# Patient Record
Sex: Female | Born: 1935 | Race: White | Hispanic: No | Smoking: Never smoker
Health system: Southern US, Community
[De-identification: ages and names within clinical notes are randomized; demographics above are authoritative.]

## PROBLEM LIST (undated history)

## (undated) DIAGNOSIS — R233 Spontaneous ecchymoses: Secondary | ICD-10-CM

## (undated) DIAGNOSIS — C50911 Malignant neoplasm of unspecified site of right female breast: Secondary | ICD-10-CM

## (undated) DIAGNOSIS — T783XXA Angioneurotic edema, initial encounter: Secondary | ICD-10-CM

## (undated) DIAGNOSIS — L509 Urticaria, unspecified: Secondary | ICD-10-CM

## (undated) DIAGNOSIS — C50919 Malignant neoplasm of unspecified site of unspecified female breast: Secondary | ICD-10-CM

## (undated) DIAGNOSIS — R238 Other skin changes: Secondary | ICD-10-CM

## (undated) DIAGNOSIS — C801 Malignant (primary) neoplasm, unspecified: Secondary | ICD-10-CM

## (undated) HISTORY — PX: APPENDECTOMY: SHX54

## (undated) HISTORY — DX: Malignant neoplasm of unspecified site of right female breast: C50.911

## (undated) HISTORY — DX: Other skin changes: R23.8

## (undated) HISTORY — DX: Spontaneous ecchymoses: R23.3

## (undated) HISTORY — DX: Angioneurotic edema, initial encounter: T78.3XXA

## (undated) HISTORY — DX: Urticaria, unspecified: L50.9

## (undated) HISTORY — DX: Malignant (primary) neoplasm, unspecified: C80.1

## (undated) HISTORY — DX: Malignant neoplasm of unspecified site of unspecified female breast: C50.919

## (undated) HISTORY — PX: CATARACT EXTRACTION: SUR2

---

## 1994-10-04 HISTORY — PX: BREAST SURGERY: SHX581

## 1998-10-19 ENCOUNTER — Other Ambulatory Visit: Admission: RE | Admit: 1998-10-19 | Discharge: 1998-10-19 | Payer: Self-pay | Admitting: Obstetrics and Gynecology

## 1998-12-12 ENCOUNTER — Ambulatory Visit (HOSPITAL_COMMUNITY): Admission: RE | Admit: 1998-12-12 | Discharge: 1998-12-12 | Payer: Self-pay | Admitting: Obstetrics and Gynecology

## 1999-09-29 ENCOUNTER — Encounter: Admission: RE | Admit: 1999-09-29 | Discharge: 1999-09-29 | Payer: Self-pay | Admitting: General Surgery

## 1999-09-29 ENCOUNTER — Encounter: Payer: Self-pay | Admitting: General Surgery

## 1999-10-04 ENCOUNTER — Other Ambulatory Visit: Admission: RE | Admit: 1999-10-04 | Discharge: 1999-10-04 | Payer: Self-pay | Admitting: Obstetrics and Gynecology

## 2000-10-03 ENCOUNTER — Encounter: Admission: RE | Admit: 2000-10-03 | Discharge: 2000-10-03 | Payer: Self-pay | Admitting: Obstetrics and Gynecology

## 2000-10-03 ENCOUNTER — Encounter: Payer: Self-pay | Admitting: Obstetrics and Gynecology

## 2000-10-18 ENCOUNTER — Other Ambulatory Visit: Admission: RE | Admit: 2000-10-18 | Discharge: 2000-10-18 | Payer: Self-pay | Admitting: Obstetrics and Gynecology

## 2001-10-30 ENCOUNTER — Encounter: Admission: RE | Admit: 2001-10-30 | Discharge: 2001-10-30 | Payer: Self-pay | Admitting: Obstetrics and Gynecology

## 2001-10-30 ENCOUNTER — Encounter: Payer: Self-pay | Admitting: Obstetrics and Gynecology

## 2001-12-10 ENCOUNTER — Other Ambulatory Visit: Admission: RE | Admit: 2001-12-10 | Discharge: 2001-12-10 | Payer: Self-pay | Admitting: Obstetrics & Gynecology

## 2012-01-24 ENCOUNTER — Encounter (INDEPENDENT_AMBULATORY_CARE_PROVIDER_SITE_OTHER): Payer: Self-pay | Admitting: Surgery

## 2012-01-27 ENCOUNTER — Encounter (INDEPENDENT_AMBULATORY_CARE_PROVIDER_SITE_OTHER): Payer: Self-pay | Admitting: Surgery

## 2012-01-27 ENCOUNTER — Ambulatory Visit (INDEPENDENT_AMBULATORY_CARE_PROVIDER_SITE_OTHER): Payer: Medicare Other | Admitting: Surgery

## 2012-01-27 VITALS — BP 120/86 | HR 64 | Temp 97.3°F | Resp 14 | Ht 65.0 in | Wt 109.6 lb

## 2012-01-27 DIAGNOSIS — C50919 Malignant neoplasm of unspecified site of unspecified female breast: Secondary | ICD-10-CM

## 2012-01-27 DIAGNOSIS — C50912 Malignant neoplasm of unspecified site of left female breast: Secondary | ICD-10-CM | POA: Insufficient documentation

## 2012-01-27 NOTE — Progress Notes (Signed)
Long-term followup of a patient of Dr. Theron Arista who is status post left lumpectomy and axillary lymph node dissection in 1996 followed by radiation and tamoxifen. Her mammogram last year at [facility] was normal. She has had no changes in her health status. She comes in today for her annual examination.  Examination On the right side, the patient has fibrocystic changes with no dominant masses. No axillary or supraclavicular lymphadenopathy.  On the left side, she has some contraction at her axillary scar. No palpable masses in the left breast. No lymphadenopathy noted.  Plan Continue with annual examinations and mammograms. Followup one year  Wilmon Arms. Corliss Skains, MD, [facility] Surgery  01/27/2012 11:41 AM

## 2012-02-14 ENCOUNTER — Encounter (INDEPENDENT_AMBULATORY_CARE_PROVIDER_SITE_OTHER): Payer: Self-pay

## 2012-10-02 ENCOUNTER — Encounter (INDEPENDENT_AMBULATORY_CARE_PROVIDER_SITE_OTHER): Payer: Self-pay

## 2013-01-29 ENCOUNTER — Ambulatory Visit (INDEPENDENT_AMBULATORY_CARE_PROVIDER_SITE_OTHER): Payer: Medicare PPO | Admitting: Surgery

## 2013-01-29 ENCOUNTER — Ambulatory Visit (INDEPENDENT_AMBULATORY_CARE_PROVIDER_SITE_OTHER): Payer: Medicare Other | Admitting: Surgery

## 2013-01-29 ENCOUNTER — Encounter (INDEPENDENT_AMBULATORY_CARE_PROVIDER_SITE_OTHER): Payer: Self-pay | Admitting: Surgery

## 2013-01-29 VITALS — BP 110/70 | HR 62 | Temp 98.1°F | Resp 18 | Ht 65.0 in | Wt 110.6 lb

## 2013-01-29 DIAGNOSIS — C50912 Malignant neoplasm of unspecified site of left female breast: Secondary | ICD-10-CM

## 2013-01-29 DIAGNOSIS — C50919 Malignant neoplasm of unspecified site of unspecified female breast: Secondary | ICD-10-CM

## 2013-01-29 NOTE — Progress Notes (Signed)
Long-term followup of a patient of Dr. Francina Ames who is status post left lumpectomy and axillary lymph node dissection in 1996 followed by radiation and tamoxifen. Her mammogram last year at [facility] from 08/17/12 was normal. She has had no changes in her health status. She comes in today for her annual examination.  Examination  On the right side, the patient has fibrocystic changes with no dominant masses. No axillary or supraclavicular lymphadenopathy.  On the left side, she has some contraction at her axillary scar. No palpable masses in the left breast. No lymphadenopathy noted.     Plan  Continue with annual examinations and mammograms. Followup one year   Wilmon Arms. Corliss Skains, MD, [facility] Surgery  General/ Trauma Surgery  01/29/2013 3:39 PM

## 2013-12-24 ENCOUNTER — Encounter: Payer: Self-pay | Admitting: Podiatrist

## 2013-12-24 ENCOUNTER — Ambulatory Visit (INDEPENDENT_AMBULATORY_CARE_PROVIDER_SITE_OTHER): Payer: Medicare PPO | Admitting: Podiatrist

## 2013-12-24 ENCOUNTER — Ambulatory Visit: Payer: Medicare PPO

## 2013-12-24 VITALS — BP 126/82 | HR 78 | Resp 18

## 2013-12-24 DIAGNOSIS — M7150 Other bursitis, not elsewhere classified, unspecified site: Secondary | ICD-10-CM

## 2013-12-24 DIAGNOSIS — R52 Pain, unspecified: Secondary | ICD-10-CM

## 2013-12-24 DIAGNOSIS — M1990 Unspecified osteoarthritis, unspecified site: Secondary | ICD-10-CM

## 2013-12-24 MED ORDER — DICLOFENAC SODIUM 1 % TD GEL: 2.0000 g | Freq: Four times a day (QID) | TRANSDERMAL | Status: DC

## 2013-12-24 NOTE — Progress Notes (Signed)
   Subjective:    Patient ID: Theresa Lutz, female    DOB: 06/12/1933, 80 y.o.   MRN: [id number]  HPI this foot has hurt for 2 weeks now and hurts on top and now has moved to the bunion area on my right foot and sore and tender and hurts with shoes and some redness    Review of Systems  Hematological: Bruises/bleeds easily.  All other systems reviewed and are negative.      Objective:   Physical Exam Neurovascular status is intact with palpable pedal pulses at 2/4 DP and PT bilateral and capillary refill time within normal limits. Neurological sensation is also intact bilaterally both upper critically and protectively. Severe bunion deformity is present on the right foot. Elongated second digit and second metatarsal is noted. Arthritic changes at the dorsal mid foot is noted right in comparison with the left. Discomfort on palpation in this area is also present and mild. Dermatological examination reveals intact and supple skin.  X-ray show arthritis right foot. No acute osseous abnormalities are present.    Assessment & Plan:  Arthritis and bursitis right foot  Plan: The patient has decreased significantly and therefore conservative treatments were discussed including icing, compression, shoe gear changes. Discussed an injection however at this time her symptoms do not wart an injection. She'll be seen back as needed for followup if she has any problems or concerns she will call.

## 2013-12-24 NOTE — Patient Instructions (Signed)

## 2014-01-02 NOTE — Progress Notes (Signed)
I went into the chart review and I went to the imaging and it shows that the x-rays are in/Theresa Lutz

## 2014-01-06 ENCOUNTER — Encounter (INDEPENDENT_AMBULATORY_CARE_PROVIDER_SITE_OTHER): Payer: Self-pay | Admitting: Surgery

## 2014-02-19 ENCOUNTER — Encounter (INDEPENDENT_AMBULATORY_CARE_PROVIDER_SITE_OTHER): Payer: Self-pay

## 2015-02-13 ENCOUNTER — Other Ambulatory Visit: Payer: Self-pay

## 2015-02-23 ENCOUNTER — Ambulatory Visit: Payer: Self-pay | Admitting: Surgery

## 2015-02-23 DIAGNOSIS — C50911 Malignant neoplasm of unspecified site of right female breast: Secondary | ICD-10-CM

## 2015-02-23 NOTE — H&P (Signed)
History of Present Illness Theresa Lutz. Theresa Selner MD; 02/23/2015 2:05 PM) Patient words: breast mass.  The patient is a 81 year old female who presents with breast cancer. This is a fairly healthy 81 year old female who underwent left lumpectomy and axillary lymph node dissection in 1996 by Dr. Marylene Buerger for invasive mammary cancer. Surgery was followed by radiation as well as 5 years of tamoxifen. The patient underwent screening mammogram on 01/29/15 at [facility]. This showed no sign of any suspicious findings on the left side. However the right side had a mass that required further investigation. On 02/11/15 she underwent ultrasound and diagnostic mammogram. This showed a suspicious 4 mm mass in the upper-inner quadrant of the right breast at the posterior depth. There is no sign of any lymphadenopathy on the right. She underwent core biopsy on 02/13/15. This revealed invasive and in situ mammary carcinoma. Further studies showed ER PR strongly positive Ki-67 10% the diagnosis shows invasive and in situ lobular carcinoma. The patient presents now for surgical discussion.  Previous surgeon - Dr. Marylene Buerger Oncology - [name] Other Problems Theresa Borer, LPN; 5/62/1308 6:57 AM) Breast Cancer  Past Surgical History (Theresa Eversole, LPN; 8/46/9629 5:28 AM) Appendectomy Breast Biopsy Bilateral. Breast Mass; Local Excision Left. Cataract Surgery Bilateral. Colon Polyp Removal - Colonoscopy Sentinel Lymph Node Biopsy  Diagnostic Studies History (Theresa Eversole, LPN; 12/17/2438 1:02 AM) Colonoscopy 1-5 years ago Mammogram within last year  Allergies (Theresa Eversole, LPN; 03/30/3663 4:03 AM) No Known Drug Allergies 02/23/2015  Medication History (Theresa Eversole, LPN; 4/74/2595 6:38 AM) Calcium & Vit D3 Bone Health (Oral) Active.  Social History (Theresa Eversole, LPN; 7/56/4332 9:51 AM) Alcohol use Occasional alcohol use. Caffeine use  Coffee. No drug use Tobacco use Never smoker.  Family History Theresa Borer, LPN; 8/84/1660 6:30 AM) Cerebrovascular Accident Mother. Diabetes Mellitus Son. Hypertension Mother. Respiratory Condition Father.  Pregnancy / Birth History Theresa Borer, LPN; 1/60/1093 2:35 AM) Age at menarche 13 years. Age of menopause 89-50 Gravida 53 Maternal age 47-25 Para 68     Review of Systems (Theresa Eversole LPN; 5/73/2202 5:42 AM) General Not Present- Appetite Loss, Chills, Fatigue, Fever, Night Sweats, Weight Gain and Weight Loss. Skin Not Present- Change in Wart/Mole, Dryness, Hives, Jaundice, New Lesions, Non-Healing Wounds, Rash and Ulcer. HEENT Not Present- Earache, Hearing Loss, Hoarseness, Nose Bleed, Oral Ulcers, Ringing in the Ears, Seasonal Allergies, Sinus Pain, Sore Throat, Visual Disturbances, Wears glasses/contact lenses and Yellow Eyes. Respiratory Not Present- Bloody sputum, Chronic Cough, Difficulty Breathing, Snoring and Wheezing. Breast Present- Breast Mass. Not Present- Breast Pain, Nipple Discharge and Skin Changes. Cardiovascular Not Present- Chest Pain, Difficulty Breathing Lying Down, Leg Cramps, Palpitations, Rapid Heart Rate, Shortness of Breath and Swelling of Extremities. Gastrointestinal Not Present- Abdominal Pain, Bloating, Bloody Stool, Change in Bowel Habits, Chronic diarrhea, Constipation, Difficulty Swallowing, Excessive gas, Gets full quickly at meals, Hemorrhoids, Indigestion, Nausea, Rectal Pain and Vomiting. Female Genitourinary Not Present- Frequency, Nocturia, Painful Urination, Pelvic Pain and Urgency. Musculoskeletal Not Present- Back Pain, Joint Pain, Joint Stiffness, Muscle Pain, Muscle Weakness and Swelling of Extremities. Neurological Not Present- Decreased Memory, Fainting, Headaches, Numbness, Seizures, Tingling, Tremor, Trouble walking and Weakness. Psychiatric Not Present- Anxiety, Bipolar, Change in Sleep Pattern, Depression, Fearful  and Frequent crying. Endocrine Not Present- Cold Intolerance, Excessive Hunger, Hair Changes, Heat Intolerance, Hot flashes and New Diabetes. Hematology Not Present- Easy Bruising, Excessive bleeding, Gland problems, HIV and Persistent Infections.  Vitals (Theresa Eversole LPN; 03/11/2375 2:83 AM) 02/23/2015 9:32  AM Weight: 111.8 lb Height: 64in Body Surface Area: 1.51 m Body Mass Index: 19.19 kg/m Temp.: 97.63F(Oral)  Pulse: 82 (Regular)  BP: 142/80 (Sitting, Left Arm, Standard)     Physical Exam [name] K. Prudence Heiny MD; 02/23/2015 2:04 PM)  The physical exam findings are as follows: Note:WDWN in NAD HEENT: EOMI, sclera anicteric Neck: No masses, no thyromegaly Lungs: CTA bilaterally; normal respiratory effort Breasts - well-healed left breast incision and axillary incision. No palpable masses or lymphadenopathy; No retraction or nipple discharge. Right breast - no palpable masses or lymphadenopathy CV: Regular rate and rhythm; no murmurs Abd: +bowel sounds, soft, non-tender, no masses Ext: Well-perfused; no edema Skin: Warm, dry; no sign of jaundice    Assessment & Plan [name] K. [name] MD; 02/23/2015 2:04 PM)  INVASIVE LOBULAR CARCINOMA OF LEFT BREAST, STAGE 1 (174.9  C50.912)  Current Plans Schedule for Surgery - left seed-localized lumpectomy and sentinel lymph node biopsy. The surgical procedure has been discussed with the patient. Potential risks, benefits, alternative treatments, and expected outcomes have been explained. All of the patient's questions at this time have been answered. The likelihood of reaching the patient's treatment goal is good. The patient understand the proposed surgical procedure and wishes to proceed. Note:We discussed treatment options - mastectomy vs. breast conserving therapy. She has opted for breast conserving therapy, similar to her previous breast cancer.   Theresa Lutz. [name], MD, [facility] Surgery  General/ Trauma  Surgery  02/23/2015 2:05 PM

## 2015-02-27 ENCOUNTER — Encounter (HOSPITAL_COMMUNITY)
Admission: RE | Admit: 2015-02-27 | Discharge: 2015-02-27 | Disposition: A | Discharge status: Home / Self Care | Payer: Medicare PPO | Source: Ambulatory Visit | Attending: Surgery | Admitting: Surgery

## 2015-02-27 ENCOUNTER — Encounter (HOSPITAL_COMMUNITY): Payer: Self-pay

## 2015-02-27 DIAGNOSIS — C50911 Malignant neoplasm of unspecified site of right female breast: Secondary | ICD-10-CM | POA: Insufficient documentation

## 2015-02-27 DIAGNOSIS — Z01812 Encounter for preprocedural laboratory examination: Secondary | ICD-10-CM | POA: Diagnosis not present

## 2015-02-27 LAB — BASIC METABOLIC PANEL
Anion gap: 8 (ref 5–15)
BUN: 9 mg/dL (ref 6–20)
CALCIUM: 8.8 mg/dL — AB (ref 8.9–10.3)
CO2: 26 mmol/L (ref 22–32)
Chloride: 104 mmol/L (ref 101–111)
Creatinine, Ser: 1.05 mg/dL — ABNORMAL HIGH (ref 0.44–1.00)
GFR calc Af Amer: 57 mL/min — ABNORMAL LOW (ref >60)
GFR, EST NON AFRICAN AMERICAN: 49 mL/min — AB (ref >60)
Glucose, Bld: 92 mg/dL (ref 65–99)
Potassium: 4 mmol/L (ref 3.5–5.1)
SODIUM: 138 mmol/L (ref 135–145)

## 2015-02-27 LAB — CBC
HEMATOCRIT: 43.5 % (ref 36.0–46.0)
HEMOGLOBIN: 14.9 g/dL (ref 12.0–15.0)
MCH: 35.3 pg — ABNORMAL HIGH (ref 26.0–34.0)
MCHC: 34.3 g/dL (ref 30.0–36.0)
MCV: 103.1 fL — ABNORMAL HIGH (ref 78.0–100.0)
Platelets: 234 10*3/uL (ref 150–400)
RBC: 4.22 MIL/uL (ref 3.87–5.11)
RDW: 13 % (ref 11.5–15.5)
WBC: 7.4 10*3/uL (ref 4.0–10.5)

## 2015-02-27 NOTE — Pre-Procedure Instructions (Signed)
    Theresa Lutz  02/27/2015      [name] - [place], [place] - [address] [name]  [address] Phone: [phone] Fax: [phone]    Your procedure is scheduled on 03-04-2015  Wednesday   Report to [facility] Admitting at 6:30 A.M.   Call this number if you have problems the morning of surgery:  [phone]   Remember:  Do not eat food or drink liquids after midnight.   Take these medicines the morning of surgery with A SIP OF WATER  NONE   Do not wear jewelry, make-up or nail polish.  Do not wear lotions, powders, or perfumes.  .  Do not shave 48 hours prior to surgery.     Do not bring valuables to the hospital.  [facility] is not responsible for any belongings or valuables.  Contacts, dentures or bridgework may not be worn into surgery.  Leave your suitcase in the car.  After surgery it may be brought to your room.  For patients admitted to the hospital, discharge time will be determined by your treatment team.  Patients discharged the day of surgery will not be allowed to drive home.   Name and phone number of your driver:      Special instructions:  See attached sheet for instructions on CHG shower/bath  Please read over the following fact sheets that you were given. Pain Booklet, Coughing and Deep Breathing and Surgical Site Infection Prevention

## 2015-03-02 ENCOUNTER — Other Ambulatory Visit: Payer: Self-pay | Admitting: Surgery

## 2015-03-02 DIAGNOSIS — C50911 Malignant neoplasm of unspecified site of right female breast: Secondary | ICD-10-CM

## 2015-03-03 ENCOUNTER — Ambulatory Visit
Admission: RE | Admit: 2015-03-03 | Discharge: 2015-03-03 | Disposition: A | Discharge status: Home / Self Care | Payer: Medicare PPO | Source: Ambulatory Visit | Attending: Surgery | Admitting: Surgery

## 2015-03-03 DIAGNOSIS — C50911 Malignant neoplasm of unspecified site of right female breast: Secondary | ICD-10-CM

## 2015-03-04 ENCOUNTER — Ambulatory Visit
Admission: RE | Admit: 2015-03-04 | Discharge: 2015-03-04 | Disposition: A | Discharge status: Home / Self Care | Payer: Medicare PPO | Source: Ambulatory Visit | Attending: Surgery | Admitting: Surgery

## 2015-03-04 ENCOUNTER — Encounter (HOSPITAL_COMMUNITY): Payer: Self-pay | Admitting: *Deleted

## 2015-03-04 ENCOUNTER — Ambulatory Visit (HOSPITAL_COMMUNITY): Payer: Medicare PPO | Admitting: Anesthesiology

## 2015-03-04 ENCOUNTER — Ambulatory Visit (HOSPITAL_COMMUNITY)
Admission: RE | Admit: 2015-03-04 | Discharge: 2015-03-04 | Disposition: A | Discharge status: Home / Self Care | Payer: Medicare PPO | Source: Ambulatory Visit | Attending: Surgery | Admitting: Surgery

## 2015-03-04 ENCOUNTER — Encounter (HOSPITAL_COMMUNITY)
Admission: RE | Disposition: A | Discharge status: Home / Self Care | Payer: Self-pay | Source: Ambulatory Visit | Attending: Surgery

## 2015-03-04 DIAGNOSIS — N6091 Unspecified benign mammary dysplasia of right breast: Secondary | ICD-10-CM | POA: Diagnosis not present

## 2015-03-04 DIAGNOSIS — N6011 Diffuse cystic mastopathy of right breast: Secondary | ICD-10-CM | POA: Insufficient documentation

## 2015-03-04 DIAGNOSIS — D0501 Lobular carcinoma in situ of right breast: Secondary | ICD-10-CM | POA: Insufficient documentation

## 2015-03-04 DIAGNOSIS — R920 Mammographic microcalcification found on diagnostic imaging of breast: Secondary | ICD-10-CM | POA: Insufficient documentation

## 2015-03-04 DIAGNOSIS — N62 Hypertrophy of breast: Secondary | ICD-10-CM | POA: Insufficient documentation

## 2015-03-04 DIAGNOSIS — C50911 Malignant neoplasm of unspecified site of right female breast: Secondary | ICD-10-CM

## 2015-03-04 DIAGNOSIS — Z923 Personal history of irradiation: Secondary | ICD-10-CM | POA: Diagnosis not present

## 2015-03-04 DIAGNOSIS — Z79899 Other long term (current) drug therapy: Secondary | ICD-10-CM | POA: Diagnosis not present

## 2015-03-04 HISTORY — PX: BREAST LUMPECTOMY WITH RADIOACTIVE SEED AND SENTINEL LYMPH NODE BIOPSY: SHX6550

## 2015-03-04 SURGERY — BREAST LUMPECTOMY WITH RADIOACTIVE SEED AND SENTINEL LYMPH NODE BIOPSY
Anesthesia: General | Site: Breast | Laterality: Right

## 2015-03-04 MED ORDER — HYDROCODONE-ACETAMINOPHEN 5-325 MG PO TABS: 1.0000 | ORAL_TABLET | ORAL | Status: DC | PRN

## 2015-03-04 MED ORDER — MIDAZOLAM HCL 2 MG/2ML IJ SOLN
INTRAMUSCULAR | Status: AC
  Administered 2015-03-04: 1 mg via INTRAVENOUS
  Filled 2015-03-04: qty 2

## 2015-03-04 MED ORDER — TECHNETIUM TC 99M SULFUR COLLOID FILTERED
1.0000 | Freq: Once | INTRAVENOUS | Status: AC | PRN
  Administered 2015-03-04: 1 via INTRADERMAL

## 2015-03-04 MED ORDER — FENTANYL CITRATE (PF) 250 MCG/5ML IJ SOLN
INTRAMUSCULAR | Status: AC
  Filled 2015-03-04: qty 5

## 2015-03-04 MED ORDER — LIDOCAINE HCL (CARDIAC) 20 MG/ML IV SOLN
INTRAVENOUS | Status: DC | PRN
  Administered 2015-03-04: 40 mg via INTRAVENOUS

## 2015-03-04 MED ORDER — SUCCINYLCHOLINE CHLORIDE 20 MG/ML IJ SOLN
INTRAMUSCULAR | Status: DC | PRN
  Administered 2015-03-04: 80 mg via INTRAVENOUS

## 2015-03-04 MED ORDER — FENTANYL CITRATE (PF) 100 MCG/2ML IJ SOLN: 25.0000 ug | INTRAMUSCULAR | Status: DC | PRN

## 2015-03-04 MED ORDER — DEXAMETHASONE SODIUM PHOSPHATE 4 MG/ML IJ SOLN
INTRAMUSCULAR | Status: DC | PRN
  Administered 2015-03-04: 4 mg via INTRAVENOUS

## 2015-03-04 MED ORDER — PROPOFOL 10 MG/ML IV BOLUS
INTRAVENOUS | Status: AC
  Filled 2015-03-04: qty 20

## 2015-03-04 MED ORDER — CHLORHEXIDINE GLUCONATE 4 % EX LIQD: 1.0000 "application " | Freq: Once | CUTANEOUS | Status: DC

## 2015-03-04 MED ORDER — 0.9 % SODIUM CHLORIDE (POUR BTL) OPTIME
TOPICAL | Status: DC | PRN
  Administered 2015-03-04: 1000 mL

## 2015-03-04 MED ORDER — ONDANSETRON HCL 4 MG/2ML IJ SOLN
INTRAMUSCULAR | Status: AC
  Filled 2015-03-04: qty 2

## 2015-03-04 MED ORDER — ROCURONIUM BROMIDE 50 MG/5ML IV SOLN
INTRAVENOUS | Status: AC
  Filled 2015-03-04: qty 1

## 2015-03-04 MED ORDER — BUPIVACAINE-EPINEPHRINE 0.25% -1:200000 IJ SOLN
INTRAMUSCULAR | Status: DC | PRN
  Administered 2015-03-04: 18 mL

## 2015-03-04 MED ORDER — ONDANSETRON HCL 4 MG/2ML IJ SOLN: 4.0000 mg | INTRAMUSCULAR | Status: DC | PRN

## 2015-03-04 MED ORDER — CEFAZOLIN SODIUM-DEXTROSE 2-3 GM-% IV SOLR
2.0000 g | INTRAVENOUS | Status: AC
  Administered 2015-03-04: 2 g via INTRAVENOUS
  Filled 2015-03-04: qty 50

## 2015-03-04 MED ORDER — PROPOFOL 10 MG/ML IV BOLUS
INTRAVENOUS | Status: DC | PRN
  Administered 2015-03-04: 70 mg via INTRAVENOUS
  Administered 2015-03-04: 110 mg via INTRAVENOUS
  Administered 2015-03-04: 20 mg via INTRAVENOUS

## 2015-03-04 MED ORDER — LACTATED RINGERS IV SOLN
INTRAVENOUS | Status: DC | PRN
  Administered 2015-03-04 (×2): via INTRAVENOUS

## 2015-03-04 MED ORDER — LIDOCAINE HCL (CARDIAC) 20 MG/ML IV SOLN
INTRAVENOUS | Status: AC
  Filled 2015-03-04: qty 5

## 2015-03-04 MED ORDER — SODIUM CHLORIDE 0.9 % IJ SOLN
INTRAMUSCULAR | Status: AC
  Filled 2015-03-04: qty 10

## 2015-03-04 MED ORDER — SUCCINYLCHOLINE CHLORIDE 20 MG/ML IJ SOLN
INTRAMUSCULAR | Status: AC
  Filled 2015-03-04: qty 1

## 2015-03-04 MED ORDER — ONDANSETRON HCL 4 MG/2ML IJ SOLN
INTRAMUSCULAR | Status: DC | PRN
  Administered 2015-03-04: 4 mg via INTRAVENOUS

## 2015-03-04 MED ORDER — PHENYLEPHRINE 40 MCG/ML (10ML) SYRINGE FOR IV PUSH (FOR BLOOD PRESSURE SUPPORT)
PREFILLED_SYRINGE | INTRAVENOUS | Status: AC
  Filled 2015-03-04: qty 10

## 2015-03-04 MED ORDER — PHENYLEPHRINE HCL 10 MG/ML IJ SOLN
10.0000 mg | INTRAVENOUS | Status: DC | PRN
  Administered 2015-03-04: 25 ug/min via INTRAVENOUS

## 2015-03-04 MED ORDER — METHYLENE BLUE 1 % INJ SOLN
INTRAMUSCULAR | Status: AC
  Filled 2015-03-04: qty 10

## 2015-03-04 MED ORDER — EPHEDRINE SULFATE 50 MG/ML IJ SOLN
INTRAMUSCULAR | Status: AC
  Filled 2015-03-04: qty 1

## 2015-03-04 MED ORDER — MORPHINE SULFATE 2 MG/ML IJ SOLN: 2.0000 mg | INTRAMUSCULAR | Status: DC | PRN

## 2015-03-04 MED ORDER — BUPIVACAINE-EPINEPHRINE (PF) 0.25% -1:200000 IJ SOLN
INTRAMUSCULAR | Status: AC
  Filled 2015-03-04: qty 30

## 2015-03-04 MED ORDER — METHYLENE BLUE 1 % INJ SOLN
INTRAMUSCULAR | Status: DC | PRN
  Administered 2015-03-04: 5 mL via INTRAMUSCULAR

## 2015-03-04 MED ORDER — DEXAMETHASONE SODIUM PHOSPHATE 4 MG/ML IJ SOLN
INTRAMUSCULAR | Status: AC
  Filled 2015-03-04: qty 1

## 2015-03-04 MED ORDER — FENTANYL CITRATE (PF) 100 MCG/2ML IJ SOLN
INTRAMUSCULAR | Status: DC | PRN
  Administered 2015-03-04 (×2): 75 ug via INTRAVENOUS

## 2015-03-04 SURGICAL SUPPLY — 43 items
APPLIER CLIP 9.375 MED OPEN (MISCELLANEOUS) ×3
BINDER BREAST LRG (GAUZE/BANDAGES/DRESSINGS) IMPLANT
BINDER BREAST XLRG (GAUZE/BANDAGES/DRESSINGS) IMPLANT
BLADE SURG 15 STRL LF DISP TIS (BLADE) ×1 IMPLANT
BLADE SURG 15 STRL SS (BLADE) ×2
CANISTER SUCTION 2500CC (MISCELLANEOUS) ×3 IMPLANT
CHLORAPREP W/TINT 26ML (MISCELLANEOUS) ×3 IMPLANT
CLIP APPLIE 9.375 MED OPEN (MISCELLANEOUS) ×1 IMPLANT
CONT SPECI 4OZ STER CLIK (MISCELLANEOUS) ×6 IMPLANT
COVER PROBE W GEL 5X96 (DRAPES) ×3 IMPLANT
COVER SURGICAL LIGHT HANDLE (MISCELLANEOUS) ×3 IMPLANT
DEVICE DUBIN SPECIMEN MAMMOGRA (MISCELLANEOUS) ×3 IMPLANT
DRAPE CHEST BREAST 15X10 FENES (DRAPES) ×3 IMPLANT
DRAPE UTILITY W/TAPE 26X15 (DRAPES) IMPLANT
DRAPE UTILITY XL STRL (DRAPES) ×3 IMPLANT
ELECT CAUTERY BLADE 6.4 (BLADE) ×3 IMPLANT
ELECT REM PT RETURN 9FT ADLT (ELECTROSURGICAL) ×3
ELECTRODE REM PT RTRN 9FT ADLT (ELECTROSURGICAL) ×1 IMPLANT
GLOVE BIO SURGEON STRL SZ7 (GLOVE) ×3 IMPLANT
GLOVE BIOGEL PI IND STRL 7.5 (GLOVE) ×1 IMPLANT
GLOVE BIOGEL PI INDICATOR 7.5 (GLOVE) ×2
GOWN STRL REUS W/ TWL LRG LVL3 (GOWN DISPOSABLE) ×3 IMPLANT
GOWN STRL REUS W/TWL LRG LVL3 (GOWN DISPOSABLE) ×6
KIT BASIN OR (CUSTOM PROCEDURE TRAY) ×3 IMPLANT
KIT MARKER MARGIN INK (KITS) ×3 IMPLANT
LIQUID BAND (GAUZE/BANDAGES/DRESSINGS) ×3 IMPLANT
NDL SAFETY ECLIPSE 18X1.5 (NEEDLE) ×1 IMPLANT
NEEDLE HYPO 18GX1.5 SHARP (NEEDLE) ×2
NEEDLE HYPO 25X1 1.5 SAFETY (NEEDLE) ×6 IMPLANT
NS IRRIG 1000ML POUR BTL (IV SOLUTION) ×3 IMPLANT
PACK SURGICAL SETUP 50X90 (CUSTOM PROCEDURE TRAY) ×3 IMPLANT
PENCIL BUTTON HOLSTER BLD 10FT (ELECTRODE) ×3 IMPLANT
SPONGE LAP 18X18 X RAY DECT (DISPOSABLE) ×3 IMPLANT
SPONGE LAP 4X18 X RAY DECT (DISPOSABLE) ×3 IMPLANT
SUT MNCRL AB 4-0 PS2 18 (SUTURE) ×6 IMPLANT
SUT VIC AB 3-0 SH 18 (SUTURE) ×3 IMPLANT
SYR BULB 3OZ (MISCELLANEOUS) ×3 IMPLANT
SYR CONTROL 10ML LL (SYRINGE) ×6 IMPLANT
TOWEL OR 17X24 6PK STRL BLUE (TOWEL DISPOSABLE) ×6 IMPLANT
TOWEL OR 17X26 10 PK STRL BLUE (TOWEL DISPOSABLE) IMPLANT
TUBE CONNECTING 12'X1/4 (SUCTIONS) ×1
TUBE CONNECTING 12X1/4 (SUCTIONS) ×2 IMPLANT
YANKAUER SUCT BULB TIP NO VENT (SUCTIONS) ×3 IMPLANT

## 2015-03-04 NOTE — Interval H&P Note (Signed)
History and Physical Interval Note:  03/04/2015 7:37 AM  Theresa Lutz  has presented today for surgery, with the diagnosis of Right Invasive Lobules Carcinoma  The various methods of treatment have been discussed with the patient and family. After consideration of risks, benefits and other options for treatment, the patient has consented to  Procedure(s): RIGHT BREAST LUMPECTOMY WITH RADIOACTIVE SEED AND SENTINEL LYMPH NODE BIOPSY (Right) as a surgical intervention .  The patient's history has been reviewed, patient examined, no change in status, stable for surgery.  I have reviewed the patient's chart and labs.  Questions were answered to the patient's satisfaction.    Correction to the H&P - Previous radiation oncologist was Dr. Ardis Rowan K.

## 2015-03-04 NOTE — H&P (View-Only) (Signed)
History of Present Illness Theresa Lutz. Theresa Koslosky MD; 02/23/2015 2:05 PM) Patient words: breast mass.  The patient is a 81 year old female who presents with breast cancer. This is a fairly healthy 81 year old female who underwent left lumpectomy and axillary lymph node dissection in 1996 by Dr. Marylene Lutz for invasive mammary cancer. Surgery was followed by radiation as well as 5 years of tamoxifen. The patient underwent screening mammogram on 01/29/15 at [facility]. This showed no sign of any suspicious findings on the left side. However the right side had a mass that required further investigation. On 02/11/15 she underwent ultrasound and diagnostic mammogram. This showed a suspicious 4 mm mass in the upper-inner quadrant of the right breast at the posterior depth. There is no sign of any lymphadenopathy on the right. She underwent core biopsy on 02/13/15. This revealed invasive and in situ mammary carcinoma. Further studies showed ER PR strongly positive Ki-67 10% the diagnosis shows invasive and in situ lobular carcinoma. The patient presents now for surgical discussion.  Previous surgeon - Dr. Marylene Lutz Oncology - Theresa Lutz Radiation Oncology - Theresa Lutz Other Problems Theresa Borer, LPN; 2/48/2500 3:70 AM) Breast Cancer  Past Surgical History (Theresa Eversole, LPN; 4/88/8916 9:45 AM) Appendectomy Breast Biopsy Bilateral. Breast Mass; Local Excision Left. Cataract Surgery Bilateral. Colon Polyp Removal - Colonoscopy Sentinel Lymph Node Biopsy  Diagnostic Studies History (Theresa Eversole, LPN; 0/38/8828 0:03 AM) Colonoscopy 1-5 years ago Mammogram within last year  Allergies (Theresa Eversole, LPN; 4/91/7915 0:56 AM) No Known Drug Allergies 02/23/2015  Medication History (Theresa Eversole, LPN; 9/79/4801 6:55 AM) Calcium & Vit D3 Bone Health (Oral) Active.  Social History (Theresa Eversole, LPN; 3/74/8270 7:86 AM) Alcohol use Occasional alcohol use. Caffeine use  Coffee. No drug use Tobacco use Never smoker.  Family History Theresa Borer, LPN; 7/54/4920 1:00 AM) Cerebrovascular Accident Theresa Lutz. Diabetes Mellitus Theresa Lutz. Hypertension Theresa Lutz. Respiratory Condition Theresa Lutz.  Pregnancy / Birth History Theresa Borer, LPN; [birth year]/16/1974 8:83 AM) Age at menarche 77 years. Age of menopause 92-50 Gravida 83 Maternal age 28-25 Para 82     Review of Systems (Theresa Eversole LPN; 2/54/9826 4:15 AM) General Not Present- Appetite Loss, Chills, Fatigue, Fever, Night Sweats, Weight Gain and Weight Loss. Skin Not Present- Change in Wart/Mole, Dryness, Hives, Jaundice, New Lesions, Non-Healing Wounds, Rash and Ulcer. HEENT Not Present- Earache, Hearing Loss, Hoarseness, Nose Bleed, Oral Ulcers, Ringing in the Ears, Seasonal Allergies, Sinus Pain, Sore Throat, Visual Disturbances, Wears glasses/contact lenses and Yellow Eyes. Respiratory Not Present- Bloody sputum, Chronic Cough, Difficulty Breathing, Snoring and Wheezing. Breast Present- Breast Mass. Not Present- Breast Pain, Nipple Discharge and Skin Changes. Cardiovascular Not Present- Chest Pain, Difficulty Breathing Lying Down, Leg Cramps, Palpitations, Rapid Heart Rate, Shortness of Breath and Swelling of Extremities. Gastrointestinal Not Present- Abdominal Pain, Bloating, Bloody Stool, Change in Bowel Habits, Chronic diarrhea, Constipation, Difficulty Swallowing, Excessive gas, Gets full quickly at meals, Hemorrhoids, Indigestion, Nausea, Rectal Pain and Vomiting. Female Genitourinary Not Present- Frequency, Nocturia, Painful Urination, Pelvic Pain and Urgency. Musculoskeletal Not Present- Back Pain, Joint Pain, Joint Stiffness, Muscle Pain, Muscle Weakness and Swelling of Extremities. Neurological Not Present- Decreased Memory, Fainting, Headaches, Numbness, Seizures, Tingling, Tremor, Trouble walking and Weakness. Psychiatric Not Present- Anxiety, Bipolar, Change in Sleep Pattern, Depression, Fearful  and Frequent crying. Endocrine Not Present- Cold Intolerance, Excessive Hunger, Hair Changes, Heat Intolerance, Hot flashes and New Diabetes. Hematology Not Present- Easy Bruising, Excessive bleeding, Gland problems, HIV and Persistent Infections.  Vitals (Theresa Eversole LPN; 05/04/9406 6:80 AM) 02/23/2015 9:32  AM Weight: 111.8 lb Height: 64in Body Surface Area: 1.51 m Body Mass Index: 19.19 kg/m Temp.: 97.75F(Oral)  Pulse: 82 (Regular)  BP: 142/80 (Sitting, Left Arm, Standard)     Physical Exam Theresa Key K. Prescott Truex MD; 02/23/2015 2:04 PM)  The physical exam findings are as follows: Note:WDWN in NAD HEENT: EOMI, sclera anicteric Neck: No masses, no thyromegaly Lungs: CTA bilaterally; normal respiratory effort Breasts - well-healed left breast incision and axillary incision. No palpable masses or lymphadenopathy; No retraction or nipple discharge. Right breast - no palpable masses or lymphadenopathy CV: Regular rate and rhythm; no murmurs Abd: +bowel sounds, soft, non-tender, no masses Ext: Well-perfused; no edema Skin: Warm, dry; no sign of jaundice    Assessment & Plan Theresa Key K. Javen Ridings MD; 02/23/2015 2:04 PM)  INVASIVE LOBULAR CARCINOMA OF LEFT BREAST, STAGE 1 (174.9  C50.912)  Current Plans Schedule for Surgery - left seed-localized lumpectomy and sentinel lymph node biopsy. The surgical procedure has been discussed with the patient. Potential risks, benefits, alternative treatments, and expected outcomes have been explained. All of the patient's questions at this time have been answered. The likelihood of reaching the patient's treatment goal is good. The patient understand the proposed surgical procedure and wishes to proceed. Note:We discussed treatment options - mastectomy vs. breast conserving therapy. She has opted for breast conserving therapy, similar to her previous breast cancer.   Theresa Lutz. Theresa Dover, MD, [facility] Surgery  General/ Trauma  Surgery  02/23/2015 2:05 PM

## 2015-03-04 NOTE — Op Note (Signed)
Pre-op Diagnosis:  Right breast cancer Postop diagnosis: Same Procedure performed: #1 right radioactive seed localized lumpectomy #2 blue dye injection #3 right axillary sentinel lymph node biopsy Surgeon:Audrionna Lampton K. Anesthesia: Gen. Endotracheal  Indications: This is an 81 year old female with a previous history of a left breast cancer status post lumpectomy and axillary lymph node dissection. She received radiation as well as hormonal treatment. She was recently diagnosed with a small right breast cancer. She presents now for radioactive seed localized lumpectomy and axillary sentinel lymph node biopsy.  A radioactive seed was placed yesterday by radiology. She was injected with technetium sulfur colloid around her nipple in the preop area by radiology tech.  Procedure: The patient is brought to the operating room and placed in a supine position on the operating room table. After an adequate level of general anesthesia was obtained the patient's right chest was exposed. I injected methylene blue dye around the nipple after prepping with alcohol. We confirmed the presence of the radioactive seed in the right upper inner quadrant and the presence of an active sentinel lymph node in the right axilla.  The right chest and the right axilla were prepped with ChloraPrep and draped sterile fashion.  Another timeout was taken to ensure the proper patient and proper procedure. The area of activity in the right upper inner quadrant was identified with the neoprobe. We made a transverse incision near the edge of the areola. Dissection was carried down into the breast tissue with cautery. We dissected up to the area of the radioactive seed. A raised margins all the way around this area using the neoprobe for guidance. We then dissected the lumpectomy specimen off of the chest wall. Activity was confirmed within the specimen and there was no residual activity in the lumpectomy cavity. The specimen was oriented  with a paint kit and specimen mammogram confirmed the seed and the biopsy clip within the specimen. This was confirmed by radiology. The lumpectomy cavity was then irrigated and inspected for hemostasis. There was some scarring at the lateral margin so we excised this with cautery as well. This was sent separately as a lateral margin. This was not oriented with paint. We then closed the wound with 3-0 Vicryl and 4-0 Monocryl. We then turned our attention to the axilla.  The specimen and radioactive seed were hand carried to pathology and the arrival of the seed was confirmed in radiology.  The settings were changed on the neoprobe device for sentinel lymph node biopsy. We identified an area of activity and made a transverse incision across this area. Dissection was carried down into the axillary fat with cautery. I was able to easily identify a hot blue lymph node which was excised with cautery. Ex vivo activity was 1100. The  Node was hot and blue. This was sent as sentinel lymph node #1. I then interrogated the axilla again. There is minimal background activity. No other lymph nodes are palpated. The wound is then closed with 3-0 Vicryl and 4-0 Monocryl. Steri-Strips and clean dressings were applied to both areas. The patient was extubated and brought to the recovery room in stable condition.   Imogene Burn. [name], MD, [facility] Surgery  General/ Trauma Surgery  03/04/2015 10:16 AM

## 2015-03-04 NOTE — Discharge Instructions (Signed)
[facility] °Office Phone Number [phone] ° °BREAST BIOPSY/ PARTIAL MASTECTOMY: POST OP INSTRUCTIONS ° °Always review your discharge instruction sheet given to you by the facility where your surgery was performed. ° °IF YOU HAVE DISABILITY OR FAMILY LEAVE FORMS, YOU MUST BRING THEM TO THE OFFICE FOR PROCESSING.  DO NOT GIVE THEM TO YOUR DOCTOR. ° °1. A prescription for pain medication may be given to you upon discharge.  Take your pain medication as prescribed, if needed.  If narcotic pain medicine is not needed, then you may take acetaminophen (Tylenol) or ibuprofen (Advil) as needed. °2. Take your usually prescribed medications unless otherwise directed °3. If you need a refill on your pain medication, please contact your pharmacy.  They will contact our office to request authorization.  Prescriptions will not be filled after 5pm or on week-ends. °4. You should eat very light the first 24 hours after surgery, such as soup, crackers, pudding, etc.  Resume your normal diet the day after surgery. °5. Most patients will experience some swelling and bruising in the breast.  Ice packs and a good support bra will help.  Swelling and bruising can take several days to resolve.  °6. It is common to experience some constipation if taking pain medication after surgery.  Increasing fluid intake and taking a stool softener will usually help or prevent this problem from occurring.  A mild laxative (Milk of Magnesia or Miralax) should be taken according to package directions if there are no bowel movements after 48 hours. °7. Unless discharge instructions indicate otherwise, you may remove your bandages 48 hours after surgery, and you may shower at that time.  You will have steri-strips (small skin tapes) in place directly over the incision.  These strips should be left on the skin for 7-10 days.   Any sutures or staples will be removed at the office during your follow-up visit. °8. ACTIVITIES:  You may resume  regular daily activities (gradually increasing) beginning the next day.  Wearing a good support bra or sports bra minimizes pain and swelling.  You may have sexual intercourse when it is comfortable. °a. You may drive when you no longer are taking prescription pain medication, you can comfortably wear a seatbelt, and you can safely maneuver your car and apply brakes. °b. RETURN TO WORK:  1-2 weeks °9. You should see your doctor in the office for a follow-up appointment approximately two weeks after your surgery.  Your doctor’s nurse will typically make your follow-up appointment when she calls you with your pathology report.  Expect your pathology report 2-3 business days after your surgery.  You may call to check if you do not hear from us after three days. °10. OTHER INSTRUCTIONS: _______________________________________________________________________________________________ _____________________________________________________________________________________________________________________________________ °_____________________________________________________________________________________________________________________________________ °_____________________________________________________________________________________________________________________________________ ° °WHEN TO CALL YOUR DOCTOR: °1. Fever over 101.0 °2. Nausea and/or vomiting. °3. Extreme swelling or bruising. °4. Continued bleeding from incision. °5. Increased pain, redness, or drainage from the incision. ° °The clinic staff is available to answer your questions during regular business hours.  Please don’t hesitate to call and ask to speak to one of the nurses for clinical concerns.  If you have a medical emergency, go to the nearest emergency room or call 911.  A surgeon from [facility] is always on call at the hospital. ° °For further questions, please visit [email or web address]  ° ° °

## 2015-03-04 NOTE — Anesthesia Preprocedure Evaluation (Addendum)
Anesthesia Evaluation  Patient identified by MRN, date of birth, ID band Patient awake    Reviewed: Allergy & Precautions, NPO status , Patient's Chart, lab work & pertinent test results  Airway Mallampati: II  TM Distance: >3 FB Neck ROM: Full    Dental  (+) Teeth Intact   Pulmonary neg pulmonary ROS,  breath sounds clear to auscultation        Cardiovascular negative cardio ROS  Rhythm:Regular Rate:Normal     Neuro/Psych    GI/Hepatic negative GI ROS, Neg liver ROS,   Endo/Other  negative endocrine ROS  Renal/GU negative Renal ROS     Musculoskeletal   Abdominal   Peds  Hematology negative hematology ROS (+)   Anesthesia Other Findings   Reproductive/Obstetrics                            Anesthesia Physical Anesthesia Plan  ASA: II  Anesthesia Plan: General   Post-op Pain Management:    Induction:   Airway Management Planned: LMA  Additional Equipment:   Intra-op Plan:   Post-operative Plan: Extubation in OR  Informed Consent: I have reviewed the patients History and Physical, chart, labs and discussed the procedure including the risks, benefits and alternatives for the proposed anesthesia with the patient or authorized representative who has indicated his/her understanding and acceptance.   Dental advisory given  Plan Discussed with: CRNA and Surgeon  Anesthesia Plan Comments:         Anesthesia Quick Evaluation

## 2015-03-04 NOTE — Anesthesia Postprocedure Evaluation (Signed)
  Anesthesia Post-op Note  Patient: Theresa Lutz  Procedure(s) Performed: Procedure(s): RIGHT RADIOACTIVE SEED GUIDED BREAST LUMPECTOMY AND RIGHT AXILLARY SENTINEL LYMPH NODE BIOPSY (Right)  Patient Location: PACU  Anesthesia Type:General and GA combined with regional for post-op pain  Level of Consciousness: awake and alert   Airway and Oxygen Therapy: Patient Spontanous Breathing  Post-op Pain: none  Post-op Assessment: Post-op Vital signs reviewed              Post-op Vital Signs: stable  Last Vitals:  Filed Vitals:   03/04/15 1129  BP: 147/85  Pulse: 61  Temp:   Resp: 16    Complications: No apparent anesthesia complications

## 2015-03-04 NOTE — Anesthesia Procedure Notes (Signed)
Procedure Name: Intubation Date/Time: 03/04/2015 8:44 AM Performed by: Susa Loffler Pre-anesthesia Checklist: Patient identified, Timeout performed, Emergency Drugs available, Suction available and Patient being monitored Patient Re-evaluated:Patient Re-evaluated prior to inductionOxygen Delivery Method: Circle system utilized Preoxygenation: Pre-oxygenation with 100% oxygen Intubation Type: IV induction Ventilation: Mask ventilation without difficulty Laryngoscope Size: Mac and 3 Grade View: Grade I Tube type: Oral Tube size: 7.0 mm Number of attempts: 1 Airway Equipment and Method: Stylet Placement Confirmation: ETT inserted through vocal cords under direct vision,  positive ETCO2 and breath sounds checked- equal and bilateral Secured at: 21 cm Tube secured with: Tape Dental Injury: Teeth and Oropharynx as per pre-operative assessment  Comments: Attempted to place LMA#4 and #3; unable to seat either LMA with adequate seal. Dr. Orene Desanctis at bedside, convert to GETA. Easy mask, VSS throughout, atraumatic airway.

## 2015-03-04 NOTE — Transfer of Care (Signed)
Immediate Anesthesia Transfer of Care Note  Patient: Theresa Lutz  Procedure(s) Performed: Procedure(s): RIGHT RADIOACTIVE SEED GUIDED BREAST LUMPECTOMY AND RIGHT AXILLARY SENTINEL LYMPH NODE BIOPSY (Right)  Patient Location: PACU  Anesthesia Type:General  Level of Consciousness: awake, alert  and oriented  Airway & Oxygen Therapy: Patient Spontanous Breathing and Patient connected to nasal cannula oxygen  Post-op Assessment: Report given to RN and Post -op Vital signs reviewed and stable  Post vital signs: Reviewed and stable  Last Vitals:  Filed Vitals:   03/04/15 1010  BP: 134/80  Pulse: 68  Temp: 36.4 C  Resp: 24    Complications: No apparent anesthesia complications

## 2015-03-05 ENCOUNTER — Encounter (HOSPITAL_COMMUNITY): Payer: Self-pay | Admitting: Surgery

## 2015-03-30 ENCOUNTER — Telehealth: Payer: Self-pay | Admitting: *Deleted

## 2015-03-30 NOTE — Telephone Encounter (Signed)
Pt returned my call and I confirmed 03/30/15 med onc appt w/ her.  Mailed before appt letter, calendar, welcoming packet & intake form to pt.  Emailed Ammie at  [facility] to make her aware.  Called and spoke with Joelene Millin at pt's PCP and she stated that pt does not need a referral auth.  Placed a copy of the records in Dr. Delight Stare box and took one to HIM to scan.

## 2015-03-30 NOTE — Telephone Encounter (Signed)
Received referral from [facility].  Called and left a message for the pt to return my call so I can schedule her a med onc appt w/ Dr. Jana Hakim.

## 2015-04-17 NOTE — Progress Notes (Signed)
Location of Breast Cancer:breast cancer of upper-inner quadrant of the right breast   Histology per Pathology Report:   03/04/15  Diagnosis 1. Breast, lumpectomy, Right - LOBULAR NEOPLASIA (ATYPICAL LOBULAR HYPERPLASIA AND LOBULAR CARCINOMA IN SITU), SEE COMMENT. - PREVIOUS BIOPSY SITE IDENTIFIED. - SEE TUMOR SYNOPTIC TEMPLATE BELOW. 2. Lymph node, sentinel, biopsy, Right axillary - ONE LYMPH NODE, NEGATIVE FOR TUMOR (0/1), SEE COMMENT. 3. Breast, excision, Right additional lateral margin - LOBULAR NEOPLASIA (ATYPICAL LOBULAR HYPERPLASIA), SEE COMMENT. - SURGICAL MARGINS, NEGATIVE FOR ATYPIA OR MALIGNANCY.  02/13/15 Diagnosis Breast, right, needle core biopsy, 1:30 o'clock, 7 cm fn AMENDED DIAGNOSIS: - INVASIVE AND IN SITU LOBULAR CARCINOMA, SUPPORTED WITH NEGATIVE E-CADHERIN IMMUNOSTAIN. - TUMOR INVOLVES MULTIPLE CORES APPROXIMATELY 3 MM IN MAXIMUM LINEAR DIMENSION. - A BREAST PROGNOSTIC PROFILE WILL BE ORDERED ON BLOCK 1A AND SEPARATELY REPORTED. - SEE COMMENT.  Receptor Status: ER(90%), PR (5%), Her2-neu (neg)  Did patient present with symptoms (if so, please note symptoms) or was this found on screening mammography?: screening mammogram on 01/29/15   Past/Anticipated interventions by surgeon, if any: 03/04/15 -Procedure: RIGHT RADIOACTIVE SEED GUIDED BREAST LUMPECTOMY AND RIGHT AXILLARY SENTINEL LYMPH NODE BIOPSY;  Surgeon: [name], MD;  Location: [facility];  Service: General;  Laterality: Right;   Past/Anticipated interventions by medical oncology, if any: Per Dr. Jana Hakim - anastrozole prescription was given 04/21/2015.  Lymphedema issues, if any:  no  Pain issues, if any:  no   SAFETY ISSUES:  Prior radiation? Yes - to left breast in 1996  Pacemaker/ICD? no  Possible current pregnancy?no  Is the patient on methotrexate? no  Current Complaints / other details:  Reports having an infection in her right breast after surgery and took a course of keflex.  She reports  it is sensitive/tender now.  She is here with her daughter.  BP 141/83 mmHg  Pulse 67  Temp(Src) 97.8 F (36.6 C) (Oral)  Resp 12  Ht '5\' 4"'  (1.626 m)  Wt 112 lb 1.6 oz (50.848 kg)  BMI 19.23 kg/m2    Jacqulyn Liner, RN 04/17/2015,12:36 PM

## 2015-04-20 ENCOUNTER — Other Ambulatory Visit: Payer: Self-pay | Admitting: *Deleted

## 2015-04-20 DIAGNOSIS — C50912 Malignant neoplasm of unspecified site of left female breast: Secondary | ICD-10-CM

## 2015-04-21 ENCOUNTER — Ambulatory Visit: Payer: Medicare PPO

## 2015-04-21 ENCOUNTER — Encounter: Payer: Self-pay | Admitting: Oncology

## 2015-04-21 ENCOUNTER — Other Ambulatory Visit (HOSPITAL_BASED_OUTPATIENT_CLINIC_OR_DEPARTMENT_OTHER): Payer: Medicare PPO

## 2015-04-21 ENCOUNTER — Ambulatory Visit: Payer: Medicare PPO | Admitting: Radiation Oncology

## 2015-04-21 ENCOUNTER — Ambulatory Visit (HOSPITAL_BASED_OUTPATIENT_CLINIC_OR_DEPARTMENT_OTHER): Payer: Medicare PPO | Admitting: Oncology

## 2015-04-21 ENCOUNTER — Other Ambulatory Visit: Payer: Self-pay | Admitting: Nurse Practitioner

## 2015-04-21 VITALS — BP 131/82 | HR 75 | Temp 98.2°F | Resp 17 | Ht 64.0 in | Wt 113.7 lb

## 2015-04-21 DIAGNOSIS — C50911 Malignant neoplasm of unspecified site of right female breast: Secondary | ICD-10-CM

## 2015-04-21 DIAGNOSIS — C50211 Malignant neoplasm of upper-inner quadrant of right female breast: Secondary | ICD-10-CM

## 2015-04-21 DIAGNOSIS — C50912 Malignant neoplasm of unspecified site of left female breast: Secondary | ICD-10-CM

## 2015-04-21 DIAGNOSIS — C50919 Malignant neoplasm of unspecified site of unspecified female breast: Secondary | ICD-10-CM

## 2015-04-21 DIAGNOSIS — Z170 Estrogen receptor positive status [ER+]: Secondary | ICD-10-CM | POA: Diagnosis not present

## 2015-04-21 LAB — CBC WITH DIFFERENTIAL/PLATELET
BASO%: 1.4 % (ref 0.0–2.0)
Basophils Absolute: 0.1 10*3/uL (ref 0.0–0.1)
EOS ABS: 0.1 10*3/uL (ref 0.0–0.5)
EOS%: 1 % (ref 0.0–7.0)
HCT: 44 % (ref 34.8–46.6)
HEMOGLOBIN: 14.8 g/dL (ref 11.6–15.9)
LYMPH%: 27.1 % (ref 14.0–49.7)
MCH: 35.3 pg — ABNORMAL HIGH (ref 25.1–34.0)
MCHC: 33.6 g/dL (ref 31.5–36.0)
MCV: 105.1 fL — AB (ref 79.5–101.0)
MONO#: 0.6 10*3/uL (ref 0.1–0.9)
MONO%: 10.7 % (ref 0.0–14.0)
NEUT%: 59.8 % (ref 38.4–76.8)
NEUTROS ABS: 3.5 10*3/uL (ref 1.5–6.5)
Platelets: 214 10*3/uL (ref 145–400)
RBC: 4.19 10*6/uL (ref 3.70–5.45)
RDW: 13.5 % (ref 11.2–14.5)
WBC: 5.8 10*3/uL (ref 3.9–10.3)
lymph#: 1.6 10*3/uL (ref 0.9–3.3)

## 2015-04-21 LAB — COMPREHENSIVE METABOLIC PANEL (CC13)
ALBUMIN: 3.7 g/dL (ref 3.5–5.0)
ALK PHOS: 80 U/L (ref 40–150)
ALT: 16 U/L (ref 0–55)
AST: 25 U/L (ref 5–34)
Anion Gap: 9 mEq/L (ref 3–11)
BILIRUBIN TOTAL: 0.96 mg/dL (ref 0.20–1.20)
BUN: 9.9 mg/dL (ref 7.0–26.0)
CO2: 22 meq/L (ref 22–29)
CREATININE: 0.7 mg/dL (ref 0.6–1.1)
Calcium: 9.7 mg/dL (ref 8.4–10.4)
Chloride: 106 mEq/L (ref 98–109)
EGFR: 79 mL/min/{1.73_m2} — AB (ref >90)
Glucose: 107 mg/dl (ref 70–140)
Potassium: 4 mEq/L (ref 3.5–5.1)
Sodium: 137 mEq/L (ref 136–145)
TOTAL PROTEIN: 6.4 g/dL (ref 6.4–8.3)

## 2015-04-21 MED ORDER — ANASTROZOLE 1 MG PO TABS: 1.0000 mg | ORAL_TABLET | Freq: Every day | ORAL | Status: DC

## 2015-04-21 NOTE — Progress Notes (Signed)
[facility]  Telephone:(336) [phone] [phone]) [phone]     ID: [name] DOB: [birth year]  MR#: [id number]  GXQ#:[id number]  Patient Care Team: [name], MD as PCP - General (Specialist) Chauncey Cruel, MD as Consulting Physician (Oncology) [name], MD as Consulting Physician (General Surgery) [name], MD as Consulting Physician (Radiation Oncology) PCP: [name], MD OTHER MD:  CHIEF COMPLAINT: Lobular breast cancer  CURRENT TREATMENT: Anastrozole   BREAST CANCER HISTORY: Theresa Lutz is an 81 year old [place] woman I saw originally in 1996 for a left-sided breast cancer. Unfortunately the data from that tumor is not retrievable: I don't have size of primary or number of lymph nodes removed from the left axilla. The patient did receive adjuvant radiation and then took tamoxifen for 5 years. She was then discharged from follow-up.  On 01/21/2014 she had bilateral screening mammography at [facility] showing a possible mass in the right breast. Right diagnostic mammography with ultrasonography 01/29/2014 found the breast density to be category B. In the upper retroareolar right breast there was a circumscribed mass which was not palpable by exam. Ultrasound showed this to be oval-shaped and to measure 0.6 mm. There was no internal color Doppler flow. This was felt to be a benign cyst.  On 01/22/2015 bilateral screening mammography showed a possible mass in the right breast. On 02/09/2015 the patient underwent right diagnostic mammography with right breast ultrasonography. This found a mass in the "inner breast" measuring approximately 4 mm. It was not palpable. Targeted ultrasound found a hypoechoic mass with irregular margins in the upper inner quadrant measuring 4 mm, with peripheral Doppler flow. The previously identified cyst was noted again at 11:30 o'clock, unchanged. The right axilla showed no pathologic lymphadenopathy. The lymph node in  the low axilla had a upper normal cortical thickness at 3 mm but a normal fatty hilus.  Accordingly on 02/11/2015 biopsy of the right breast mass in question showed ([id number]) an invasive and in situ lobular breast cancer, E-cadherin negative, estrogen receptor 90% positive, progesterone receptor 5% positive, both with strong staining intensity, with the MIB-1 at 10% and no HER-2 amplification, the signals ratio being 1.05 and the number per cell 2.05.  On 03/03/2015 the patient had radioactive seed placement and on 03/04/2015 underwent right lumpectomy with right axillary sentinel lymph node biopsy. The pathology from this procedure (SZA [id number]) found lobular carcinoma in situ and atypical lobular hyperplasia in a 0.8 cm mass, with surgical margins negative for atypia or malignancy. The single sentinel lymph node was negative  The patient's subsequent history is as detailed below  INTERVAL HISTORY: Theresa Lutz was evaluated in the breast clinic 04/21/2015 accompanied by her daughter Theresa Lutz.  REVIEW OF SYSTEMS: There were no specific symptoms leading to the original mammogram, which was routinely scheduled. The patient denies unusual headaches, visual changes, nausea, vomiting, stiff neck, dizziness, or gait imbalance. There has been no cough, phlegm production, or pleurisy, no chest pain or pressure, and no change in bowel or bladder habits. The patient denies fever, rash, bleeding, unexplained fatigue or unexplained weight loss. Theresa Lutz exercises daily by walking her 2 dogs. A detailed review of systems was otherwise entirely negative.  PAST MEDICAL HISTORY: Past Medical History  Diagnosis Date  . Cancer     Left Br  . Bruises easily   . Breast cancer, right breast     PAST SURGICAL HISTORY: Past Surgical History  Procedure Laterality Date  . Breast surgery  10/04/1994  partial left breast mastectomy  . Appendectomy    . Breast lumpectomy with radioactive seed and sentinel lymph  node biopsy Right 03/04/2015    Procedure: RIGHT RADIOACTIVE SEED GUIDED BREAST LUMPECTOMY AND RIGHT AXILLARY SENTINEL LYMPH NODE BIOPSY;  Surgeon: [name], MD;  Location: [facility];  Service: General;  Laterality: Right;  . Cataract extraction      FAMILY HISTORY No family history on file. The patient's father died from lung cancer at the age of 79, in the setting of tobacco abuse. The patient's mother lived to be 79. Theresa Lutz was an only child. She has a first cousin diagnosed with breast cancer after the age of 79, but the patient does not recall the exact age of diagnosis. The patient's paternal grandmother may have had ovarian cancer.  GYNECOLOGIC HISTORY:  No LMP recorded. Patient is postmenopausal. Menarche age 81 first live birth age 14. The patient is GX P5. Menopause in her late 28s. She did not take hormone replacement. 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 She is a widower and lives with her daughter Theresa Lutz. 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 The other children are Theresa Lutz who lives in [facility] [place] and works as a Marine scientist; 14 Loc who lives in [place] and works as a Armed forces training and education officer, Theresa Lutz lives in [place] and works as a Art therapist and Theresa Lutz lives in [place] and works for the [employer]. The patient has 9 grandchildren. She is a [place]: In place; the patient has named 3 all her children jointly Theresa Lutz, Theresa Lutz, and Theresa Lutz) as healthcare powers of attorney   HEALTH MAINTENANCE: Social History  Substance Use Topics  . Smoking status: Never Smoker   . Smokeless tobacco: Never Used  . Alcohol Use: Yes     Comment: rarely     Colonoscopy: 2015/[facility]  PAP: 2006?   Bone density: 2015/normal  Lipid panel:  No Known  Allergies  Current Outpatient Prescriptions  Medication Sig Dispense Refill  . acetaminophen (TYLENOL) 325 MG tablet Take 650 mg by mouth every 6 (six) hours as needed.    [name] Kitchen anastrozole (ARIMIDEX) 1 MG tablet Take 1 tablet (1 mg total) by mouth daily. 90 tablet 4  . HYDROcodone-acetaminophen (NORCO/VICODIN) 5-325 MG per tablet Take 1 tablet by mouth every 4 (four) hours as needed. 40 tablet 0  . Multiple Minerals-Vitamins (CALCIUM & VIT D3 BONE HEALTH PO) Take by mouth.     No current facility-administered medications for this visit.    OBJECTIVE: Older white woman who appears well Filed Vitals:   04/21/15 1603  BP: 131/82  Pulse: 75  Temp: 98.2 F (36.8 C)  Resp: 17     Body mass index is 19.51 kg/(m^2).    ECOG FS:0 - Asymptomatic  Ocular: Sclerae unicteric, EOMs intact Ear-nose-throat: Oropharynx clear and moist Lymphatic: No cervical or supraclavicular adenopathy Lungs no rales or rhonchi, good excursion bilaterally Heart regular rate and rhythm, no murmur appreciated Abd soft, nontender, positive bowel sounds MSK no focal spinal tenderness, no joint edema Neuro: non-focal, well-oriented, appropriate affect Breasts: The right breast is status post lumpectomy. There is a small area of swelling above the scar suggestive of a hematoma or seroma. There is no dehiscence. There is no tenderness to palpation. The right  axilla is benign. The left breast is status post remote lumpectomy. There is no evidence of disease recurrence. The left axilla is benign.   LAB RESULTS:  CMP     Component Value Date/Time   NA 137 04/21/2015 1538   NA 138 02/27/2015 1352   K 4.0 04/21/2015 1538   K 4.0 02/27/2015 1352   CL 104 02/27/2015 1352   CO2 22 04/21/2015 1538   CO2 26 02/27/2015 1352   GLUCOSE 107 04/21/2015 1538   GLUCOSE 92 02/27/2015 1352   BUN 9.9 04/21/2015 1538   BUN 9 02/27/2015 1352   CREATININE 0.7 04/21/2015 1538   CREATININE 1.05* 02/27/2015 1352   CALCIUM 9.7  04/21/2015 1538   CALCIUM 8.8* 02/27/2015 1352   PROT 6.4 04/21/2015 1538   ALBUMIN 3.7 04/21/2015 1538   AST 25 04/21/2015 1538   ALT 16 04/21/2015 1538   ALKPHOS 80 04/21/2015 1538   BILITOT 0.96 04/21/2015 1538   GFRNONAA 49* 02/27/2015 1352   GFRAA 57* 02/27/2015 1352    INo results found for: SPEP, UPEP  Lab Results  Component Value Date   WBC 5.8 04/21/2015   NEUTROABS 3.5 04/21/2015   HGB 14.8 04/21/2015   HCT 44.0 04/21/2015   MCV 105.1* 04/21/2015   PLT 214 04/21/2015      Chemistry      Component Value Date/Time   NA 137 04/21/2015 1538   NA 138 02/27/2015 1352   K 4.0 04/21/2015 1538   K 4.0 02/27/2015 1352   CL 104 02/27/2015 1352   CO2 22 04/21/2015 1538   CO2 26 02/27/2015 1352   BUN 9.9 04/21/2015 1538   BUN 9 02/27/2015 1352   CREATININE 0.7 04/21/2015 1538   CREATININE 1.05* 02/27/2015 1352      Component Value Date/Time   CALCIUM 9.7 04/21/2015 1538   CALCIUM 8.8* 02/27/2015 1352   ALKPHOS 80 04/21/2015 1538   AST 25 04/21/2015 1538   ALT 16 04/21/2015 1538   BILITOT 0.96 04/21/2015 1538       No results found for: LABCA2  No components found for: LABCA125  No results for input(s): INR in the last 168 hours.  Urinalysis No results found for: COLORURINE, APPEARANCEUR, LABSPEC, PHURINE, GLUCOSEU, HGBUR, BILIRUBINUR, KETONESUR, PROTEINUR, UROBILINOGEN, NITRITE, LEUKOCYTESUR  STUDIES: Outside films reviewed  ASSESSMENT: 81 y.o. [name] woman  (1) status post left lumpectomy and sentinel lymph node sampling 1996 for a TX NX M0 invasive breast cancer (no further staging data unavailable)  (a) status post adjuvant radiation  (b) status post tamoxifen 5 years   (2) status post right breast biopsy 02/11/2015 for a clinical T1a N0, stage IA invasive lobular carcinoma, strongly estrogen receptor positive, progesterone receptor 5% positive, with an MIB-1 of 10%, and no HER-2 amplification  (3) status post right lumpectomy and sentinel lymph  node sampling 03/04/2015 for a pTX pN), stage IA area of lobular neoplasia, with no evidence of invasion; margins were clear  (4) anastrozole started 04/21/2015    PLAN: We spent the better part of today's hour-long appointment discussing the biology of breast cancer in general, and the specifics of the patient's tumor in particular. Zayley understands that we do not have records from her earlier left sided breast cancer, but that this one being in the opposite breast does not or percent of recurrence, but a new breast cancer. We discussed the difference between lobular and ductal breast cancers and she understands that lobular breast cancers can be difficult to detect and  that they tend to respond less well to chemotherapy.  We then discussed the treatment options. Certainly chemotherapy would not play a role here: The end cc guidelines for T1 1 tumors are for consideration of anti-estrogens. Accordingly we discussed the difference between tamoxifen and anastrozole and since she had 5 years of tamoxifen previously I think going with anastrozole is entirely reasonable. I went ahead and wrote her the prescription and suggested she started after she meets with Dr. Sondra Come tomorrow. At that time a decision will be made whether she should have radiation or not  I have made a return appointment for [facility] in about 3 months. I will check a vitamin D level as well as repeat baseline labs at that time. If she is tolerating anastrozole well the plan will be to continue that for 5 years. Otherwise we will consider alternatives.  I also suggested that Kloi check with her family to see if her paternal grandmother really did have ovarian cancer or whether other breast or ovarian cancers of been documented in the family as this would affect the possibility of genetic testing  The patient has a good understanding of the overall plan. She agrees with it. She knows the goal of treatment in her case is cure. She will  call with any problems that may develop before her next visit here.  Chauncey Cruel, MD   04/21/2015 6:03 PM Medical Oncology and Hematology [facility] [address] [place], [place] [address] Tel. [phone]    Fax. [phone]

## 2015-04-21 NOTE — Progress Notes (Signed)
Checked in new patient with no financial concerns. Pt has 2 insurances who will more than likely not need my services. Card given for any questions or concerns financially.

## 2015-04-22 ENCOUNTER — Telehealth: Payer: Self-pay | Admitting: Oncology

## 2015-04-22 ENCOUNTER — Encounter: Payer: Self-pay | Admitting: Radiation Oncology

## 2015-04-22 ENCOUNTER — Ambulatory Visit
Admission: RE | Admit: 2015-04-22 | Discharge: 2015-04-22 | Disposition: A | Discharge status: Home / Self Care | Payer: Medicare PPO | Source: Ambulatory Visit | Attending: Radiation Oncology | Admitting: Radiation Oncology

## 2015-04-22 ENCOUNTER — Ambulatory Visit
Admission: RE | Admit: 2015-04-22 | Discharge: 2015-04-22 | Disposition: A | Discharge status: Home / Self Care | Payer: Medicare PPO | Source: Ambulatory Visit

## 2015-04-22 VITALS — BP 141/83 | HR 67 | Temp 97.8°F | Resp 12 | Ht 64.0 in | Wt 112.1 lb

## 2015-04-22 DIAGNOSIS — C50911 Malignant neoplasm of unspecified site of right female breast: Secondary | ICD-10-CM | POA: Insufficient documentation

## 2015-04-22 DIAGNOSIS — Z510 Encounter for antineoplastic radiation therapy: Secondary | ICD-10-CM | POA: Insufficient documentation

## 2015-04-22 DIAGNOSIS — Z170 Estrogen receptor positive status [ER+]: Secondary | ICD-10-CM | POA: Diagnosis not present

## 2015-04-22 NOTE — Progress Notes (Signed)
Radiation Oncology         [phone] ________________________________  Initial Outpatient Consultationk  Name: Theresa Lutz MRN: [id number]  Date: 04/22/2015  DOB: 1932/11/25  CC:VAN EYK, [name], MD  [name], MD   REFERRING PHYSICIAN: [name]  DIAGNOSIS: Stage I-a invasive lobular carcinoma of the right breast   HISTORY OF PRESENT ILLNESS::Theresa Lutz is a 81 y.o. female who is as courtesy of Dr.  [name].   The patient was previously received radiation therapy in 1996 for a left-sided breast cancer. . The patient did  take tamoxifen for 5 years. Previous radiation treatment consisted of 5040 cGy to left breast, and 6300 cGy to left breast tumor bed. She was then discharged from follow-up.  On 01/21/2014 she had bilateral screening mammography at [facility] showing a possible mass in the right breast. Right diagnostic mammography with ultrasonography 01/29/2014 found the breast density to be category B. In the upper retroareolar right breast there was a circumscribed mass which was not palpable by exam. Ultrasound showed this to be oval-shaped and to measure 0.6 mm. There was no internal color Doppler flow. This was felt to be a benign cyst.  On 01/22/2015 bilateral screening mammography showed a possible mass in the right breast. On 02/09/2015 the patient underwent right diagnostic mammography with right breast ultrasonography. This found a mass in the "inner breast" measuring approximately 4 mm. It was not palpable. Targeted ultrasound found a hypoechoic mass with irregular margins in the upper inner quadrant measuring 4 mm, with peripheral Doppler flow. The previously identified cyst was noted again at 11:30 o'clock, unchanged. The right axilla showed no pathologic lymphadenopathy. biopsy of the right breast mass in question showed  an invasive and in situ lobular breast cancer, E-cadherin negative, estrogen receptor 90% positive, progesterone receptor 5% positive,  both with strong staining intensity, with the MIB-1 at 10% and no HER-2 amplification.  On 03/03/2015 the patient had radioactive seed placement and on 03/04/2015 underwent right lumpectomy with right axillary sentinel lymph node biopsy. The pathology from this procedure found lobular carcinoma in situ and atypical lobular hyperplasia in a 0.8 cm mass, with surgical margins negative for atypia or malignancy. The single sentinel lymph node was negative  Patient met with Dr. Jana Hakim yesterday, 8/16.   Reports having an infection in her right breast after surgery and took a course of keflex. She reports it is sensitive/tender now. She is here with her daughter. Denies any problems with arm or shoulder mobility on the right side. She lives closer to [place]. Denies swelling in legs or arms, even after surgery. She never experienced problems or discomfort after radiation treatment to the left breast. She denies beginning any new medications over the past year.   PREVIOUS RADIATION THERAPY: Yes, radiation treatment 5040 cGy to left breast, and 6300 cGy to left breast tumor bed, Feb-April 1996 for stage II a invasive ductal carcinoma of the left breast under my direction.  PAST MEDICAL HISTORY:  has a past medical history of Cancer; Bruises easily; and Breast cancer, right breast.    PAST SURGICAL HISTORY: Past Surgical History  Procedure Laterality Date  . Breast surgery  10/04/1994     partial left breast mastectomy  . Appendectomy    . Breast lumpectomy with radioactive seed and sentinel lymph node biopsy Right 03/04/2015    Procedure: RIGHT RADIOACTIVE SEED GUIDED BREAST LUMPECTOMY AND RIGHT AXILLARY SENTINEL LYMPH NODE BIOPSY;  Surgeon: [name], MD;  Location: [place];  Service: General;  Laterality: Right;  . Cataract extraction      FAMILY HISTORY: family history includes Lung cancer in her father.  SOCIAL HISTORY:  reports that she has never smoked. She has never used smokeless  tobacco. She reports that she drinks alcohol. She reports that she does not use illicit drugs.  ALLERGIES: Review of patient's allergies indicates no known allergies.  MEDICATIONS:  Current Outpatient Prescriptions  Medication Sig Dispense Refill  . acetaminophen (TYLENOL) 325 MG tablet Take 650 mg by mouth every 6 (six) hours as needed.    . Multiple Minerals-Vitamins (CALCIUM & VIT D3 BONE HEALTH PO) Take by mouth.    [name] Kitchen anastrozole (ARIMIDEX) 1 MG tablet Take 1 tablet (1 mg total) by mouth daily. (Patient not taking: Reported on 04/22/2015) 90 tablet 4  . HYDROcodone-acetaminophen (NORCO/VICODIN) 5-325 MG per tablet Take 1 tablet by mouth every 4 (four) hours as needed. (Patient not taking: Reported on 04/22/2015) 40 tablet 0   No current facility-administered medications for this encounter.    REVIEW OF SYSTEMS:  A 15 point review of systems is documented in the electronic medical record. This was obtained by the nursing staff. However, I reviewed this with the patient to discuss relevant findings and make appropriate changes.  Pertinent review of systems reviewed as above in the history of present illness   PHYSICAL EXAM:  height is '5\' 4"'  (1.626 m) and weight is 112 lb 1.6 oz (50.848 kg). Her oral temperature is 97.8 F (36.6 C). Her blood pressure is 141/83 and her pulse is 67. Her respiration is 12.    No supraclavicular or axillary adenopathy. Heart has regular rhythm and rate. Lungs clear. Normal bowel sounds. Oropharynx clear. Neurological exam normal. No edema present.  Exam of the left breast shows a faint scar in the upper inner quad, tattoos in place. No palpable mass or nipple discharge.  Exam of right breast reveals a scar in the upper inner quad adjacent to the areolar border. No palpable mass or nipple discharge. Another scar in the axillary region from her sentinel node procedure. The abdomen is soft and nontender with normal bowel sounds.   ECOG = 0  LABORATORY DATA:  Lab  Results  Component Value Date   WBC 5.8 04/21/2015   HGB 14.8 04/21/2015   HCT 44.0 04/21/2015   MCV 105.1* 04/21/2015   PLT 214 04/21/2015   NEUTROABS 3.5 04/21/2015   Lab Results  Component Value Date   NA 137 04/21/2015   K 4.0 04/21/2015   CL 104 02/27/2015   CO2 22 04/21/2015   GLUCOSE 107 04/21/2015   CREATININE 0.7 04/21/2015   CALCIUM 9.7 04/21/2015      RADIOGRAPHY: Outside imaging reviewed      IMPRESSION: Ms. Besse is an 81 yo female with Stage 1a lobular carcinoma, strongly estrogen receptor positive, progesterone receptor positive, with an MIB-1 of 10%, and no HER-2 amplification. I discussed with the patient and her daughter that her prognosis is quite good. Options to consider would be radiation therapy, hormone therapy, or a combination of both. In light of her age, I feel that she would do well with either hormone therapy or radiation treatment, both not really necessary due to her early stage and advanced age. The pt after careful consideration would like to continue with hormone therapy alone as her disease management. I have recommended she start taking her anastrozole as recommended by Dr. Jana Hakim. If she does not tolerate the hormonal therapy well, she  does understand that radiation therapy would be an option for her.     PLAN: PRN follow up in radiation oncology. She will continue close follow up with medical oncology.       This document serves as a record of services personally performed by [name], MD. It was created on his behalf by Arlyce Harman, a trained medical scribe. The creation of this record is based on the scribe's personal observations and the provider's statements to them. This document has been checked and approved by the attending provider. ------------------------------------------------  Blair Promise, PhD, MD

## 2015-04-22 NOTE — Progress Notes (Signed)
Please see the Nurse Progress Note in the MD Initial Consult Encounter for this patient. 

## 2015-04-22 NOTE — Telephone Encounter (Signed)
Spoke with patient and she is aware of her follow up °

## 2015-08-06 ENCOUNTER — Telehealth: Payer: Self-pay | Admitting: Oncology

## 2015-08-06 ENCOUNTER — Other Ambulatory Visit (HOSPITAL_BASED_OUTPATIENT_CLINIC_OR_DEPARTMENT_OTHER): Payer: Medicare PPO

## 2015-08-06 ENCOUNTER — Ambulatory Visit (HOSPITAL_BASED_OUTPATIENT_CLINIC_OR_DEPARTMENT_OTHER): Payer: Medicare PPO | Admitting: Oncology

## 2015-08-06 VITALS — BP 136/79 | HR 69 | Temp 98.5°F | Resp 18 | Ht 64.0 in | Wt 110.9 lb

## 2015-08-06 DIAGNOSIS — C50911 Malignant neoplasm of unspecified site of right female breast: Secondary | ICD-10-CM

## 2015-08-06 DIAGNOSIS — M859 Disorder of bone density and structure, unspecified: Secondary | ICD-10-CM | POA: Diagnosis not present

## 2015-08-06 DIAGNOSIS — C50211 Malignant neoplasm of upper-inner quadrant of right female breast: Secondary | ICD-10-CM | POA: Diagnosis not present

## 2015-08-06 DIAGNOSIS — C50919 Malignant neoplasm of unspecified site of unspecified female breast: Secondary | ICD-10-CM

## 2015-08-06 DIAGNOSIS — M810 Age-related osteoporosis without current pathological fracture: Secondary | ICD-10-CM | POA: Insufficient documentation

## 2015-08-06 LAB — COMPREHENSIVE METABOLIC PANEL (CC13)
ALT: 10 U/L (ref 0–55)
ANION GAP: 8 meq/L (ref 3–11)
AST: 22 U/L (ref 5–34)
Albumin: 3.8 g/dL (ref 3.5–5.0)
Alkaline Phosphatase: 68 U/L (ref 40–150)
BUN: 5.4 mg/dL — ABNORMAL LOW (ref 7.0–26.0)
CALCIUM: 9.7 mg/dL (ref 8.4–10.4)
CO2: 27 mEq/L (ref 22–29)
CREATININE: 0.7 mg/dL (ref 0.6–1.1)
Chloride: 104 mEq/L (ref 98–109)
EGFR: 82 mL/min/{1.73_m2} — ABNORMAL LOW (ref >90)
Glucose: 94 mg/dl (ref 70–140)
Potassium: 4.1 mEq/L (ref 3.5–5.1)
Sodium: 139 mEq/L (ref 136–145)
TOTAL PROTEIN: 6.8 g/dL (ref 6.4–8.3)
Total Bilirubin: 1.27 mg/dL — ABNORMAL HIGH (ref 0.20–1.20)

## 2015-08-06 LAB — CBC WITH DIFFERENTIAL/PLATELET
BASO%: 1.3 % (ref 0.0–2.0)
Basophils Absolute: 0.1 10*3/uL (ref 0.0–0.1)
EOS ABS: 0.1 10*3/uL (ref 0.0–0.5)
EOS%: 1.5 % (ref 0.0–7.0)
HCT: 47.6 % — ABNORMAL HIGH (ref 34.8–46.6)
HGB: 16 g/dL — ABNORMAL HIGH (ref 11.6–15.9)
LYMPH%: 22.6 % (ref 14.0–49.7)
MCH: 35.5 pg — ABNORMAL HIGH (ref 25.1–34.0)
MCHC: 33.6 g/dL (ref 31.5–36.0)
MCV: 105.7 fL — AB (ref 79.5–101.0)
MONO#: 0.5 10*3/uL (ref 0.1–0.9)
MONO%: 10.3 % (ref 0.0–14.0)
NEUT%: 64.3 % (ref 38.4–76.8)
NEUTROS ABS: 3.3 10*3/uL (ref 1.5–6.5)
PLATELETS: 258 10*3/uL (ref 145–400)
RBC: 4.5 10*6/uL (ref 3.70–5.45)
RDW: 13.2 % (ref 11.2–14.5)
WBC: 5.2 10*3/uL (ref 3.9–10.3)
lymph#: 1.2 10*3/uL (ref 0.9–3.3)

## 2015-08-06 MED ORDER — NAPROXEN SODIUM 220 MG PO TABS
220.0000 mg | ORAL_TABLET | Freq: Three times a day (TID) | ORAL | Status: DC | PRN
Start: 2015-08-06 — End: 2016-04-12

## 2015-08-06 NOTE — Telephone Encounter (Signed)
Appointments made and avs printed for patient °

## 2015-08-06 NOTE — Progress Notes (Signed)
[facility]  [phone] [phone]     ID: Theresa Lutz DOB: December 05, 1932  MR#: [id number]  MIW#:[id number]  Patient Care Team: [name], MD as PCP - General (Specialist) Chauncey Cruel, MD as Consulting Physician (Oncology) [name], MD as Consulting Physician (General Surgery) [name], MD as Consulting Physician (Radiation Oncology) PCP: [name], MD OTHER MD: [name]  CHIEF COMPLAINT: Lobular breast cancer  CURRENT TREATMENT: Anastrozole   BREAST CANCER HISTORY:  from the earlier summary note:  Theresa Lutz is an 81 year old [name] woman I saw originally in 1996 for a left-sided breast cancer. Unfortunately the data from that tumor is not retrievable: I don't have size of primary or number of lymph nodes removed from the left axilla. The patient did receive adjuvant radiation and then took tamoxifen for 5 years. She was then discharged from follow-up.  On 01/21/2014 she had bilateral screening mammography at [facility] showing a possible mass in the right breast. Right diagnostic mammography with ultrasonography 01/29/2014 found the breast density to be category B. In the upper retroareolar right breast there was a circumscribed mass which was not palpable by exam. Ultrasound showed this to be oval-shaped and to measure 0.6 mm. There was no internal color Doppler flow. This was felt to be a benign cyst.  On 01/22/2015 bilateral screening mammography showed a possible mass in the right breast. On 02/09/2015 the patient underwent right diagnostic mammography with right breast ultrasonography. This found a mass in the "inner breast" measuring approximately 4 mm. It was not palpable. Targeted ultrasound found a hypoechoic mass with irregular margins in the upper inner quadrant measuring 4 mm, with peripheral Doppler flow. The previously identified cyst was noted again at 11:30 o'clock, unchanged. The  right axilla showed no pathologic lymphadenopathy. The lymph node in the low axilla had a upper normal cortical thickness at 3 mm but a normal fatty hilus.  Accordingly on 02/11/2015 biopsy of the right breast mass in question showed ([id number]) an invasive and in situ lobular breast cancer, E-cadherin negative, estrogen receptor 90% positive, progesterone receptor 5% positive, both with strong staining intensity, with the MIB-1 at 10% and no HER-2 amplification, the signals ratio being 1.05 and the number per cell 2.05.  On 03/03/2015 the patient had radioactive seed placement and on 03/04/2015 underwent right lumpectomy with right axillary sentinel lymph node biopsy. The pathology from this procedure ([id number]) found lobular carcinoma in situ and atypical lobular hyperplasia in a 0.8 cm mass, with surgical margins negative for atypia or malignancy. The single sentinel lymph node was negative  The patient's subsequent history is as detailed below  INTERVAL HISTORY: [name] today for follow-up of her breast cancer. She has been on anastrozole for the past 3 months. She is tolerating it well. She is not aware of any side effects from it. In particular she denies hot flashes or vaginal dryness. She has not had the arthralgias or myalgias that many patients can experience on this drug. She obtain set at approximately $7 a month.  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 She is still having some pain. She was given hydrocodone for this but vomited. She was then prescribed Percocet. She is afraid of taking that because "it's, be the same thing". Aside from these issues a detailed review of systems today was stable  PAST MEDICAL HISTORY:  Past Medical History  Diagnosis Date  . Cancer     Left Br  . Bruises easily   . Breast cancer, right breast     PAST SURGICAL HISTORY: Past Surgical History  Procedure Laterality  Date  . Breast surgery  10/04/1994     partial left breast mastectomy  . Appendectomy    . Breast lumpectomy with radioactive seed and sentinel lymph node biopsy Right 03/04/2015    Procedure: RIGHT RADIOACTIVE SEED GUIDED BREAST LUMPECTOMY AND RIGHT AXILLARY SENTINEL LYMPH NODE BIOPSY;  Surgeon: [name], MD;  Location: [facility];  Service: General;  Laterality: Right;  . Cataract extraction      FAMILY HISTORY Family History  Problem Relation Age of Onset  . Lung cancer Father    The patient's father died from lung cancer at the age of 42, in the setting of tobacco abuse. The patient's mother lived to be 79. Theresa Lutz was an only child. She has a first cousin diagnosed with breast cancer after the age of 50, but the patient does not recall the exact age of diagnosis. The patient's paternal grandmother may have had ovarian cancer.  GYNECOLOGIC HISTORY:  No LMP recorded. Patient is postmenopausal. Menarche age 46 first live birth age 49. The patient is GX P5. Menopause in her late 53s. She did not take hormone replacement. 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 She is a widower and lives with her daughter Theresa Lutz. Theresa Lutz Financial risk analyst for a local nursing home. The other children are Theresa Lutz who lives in [place] and works as a Marine scientist; 13 Loc who lives in [place] and works as a Armed forces training and education officer, Theresa Lutz lives in [place] and works as a Art therapist and Theresa Lutz lives in [place] and works for the [employer]. The patient has 9 grandchildren. She is a [place]: In place; the patient has named 3 all her children jointly Theresa Lutz, Theresa Lutz, and Theresa Lutz) as healthcare powers of attorney   HEALTH MAINTENANCE: Social History  Substance Use Topics  . Smoking  status: Never Smoker   . Smokeless tobacco: Never Used  . Alcohol Use: Yes     Comment: rarely     Colonoscopy: 2015/[place]  PAP: 2006?   Bone density: 2015/normal  Lipid panel:  No Known Allergies  Current Outpatient Prescriptions  Medication Sig Dispense Refill  . acetaminophen (TYLENOL) 325 MG tablet Take 650 mg by mouth every 6 (six) hours as needed.    [name] Kitchen anastrozole (ARIMIDEX) 1 MG tablet Take 1 tablet (1 mg total) by mouth daily. (Patient not taking: Reported on 04/22/2015) 90 tablet 4  . HYDROcodone-acetaminophen (NORCO/VICODIN) 5-325 MG per tablet Take 1 tablet by mouth every 4 (four) hours as needed. (Patient not taking: Reported on 04/22/2015) 40 tablet 0  . Multiple Minerals-Vitamins (CALCIUM & VIT D3 BONE HEALTH PO) Take by mouth.     No current facility-administered medications for this visit.    OBJECTIVE: Older white woman  In no acute distress  Filed Vitals:   08/06/15 1206  BP: 136/79  Pulse: 69  Temp: 98.5 F (36.9 C)  Resp: 18     Body mass index is 19.03 kg/(m^2).    ECOG FS:1 - Symptomatic but completely ambulatory  Sclerae unicteric, pupils round and equal Oropharynx clear  and moist-- no thrush or other lesions No cervical or supraclavicular adenopathy Lungs no rales or rhonchi Heart regular rate and rhythm Abd soft, nontender, positive bowel sounds MSK no focal spinal tenderness, no upper extremity lymphedema , grade 1 right ankle edema Neuro: nonfocal, well oriented, appropriate affect Breasts:  deferred   LAB RESULTS:  CMP     Component Value Date/Time   NA 137 04/21/2015 1538   NA 138 02/27/2015 1352   K 4.0 04/21/2015 1538   K 4.0 02/27/2015 1352   CL 104 02/27/2015 1352   CO2 22 04/21/2015 1538   CO2 26 02/27/2015 1352   GLUCOSE 107 04/21/2015 1538   GLUCOSE 92 02/27/2015 1352   BUN 9.9 04/21/2015 1538   BUN 9 02/27/2015 1352   CREATININE 0.7 04/21/2015 1538   CREATININE 1.05* 02/27/2015 1352   CALCIUM 9.7 04/21/2015 1538    CALCIUM 8.8* 02/27/2015 1352   PROT 6.4 04/21/2015 1538   ALBUMIN 3.7 04/21/2015 1538   AST 25 04/21/2015 1538   ALT 16 04/21/2015 1538   ALKPHOS 80 04/21/2015 1538   BILITOT 0.96 04/21/2015 1538   GFRNONAA 49* 02/27/2015 1352   GFRAA 57* 02/27/2015 1352    INo results found for: SPEP, UPEP  Lab Results  Component Value Date   WBC 5.2 08/06/2015   NEUTROABS 3.3 08/06/2015   HGB 16.0* 08/06/2015   HCT 47.6* 08/06/2015   MCV 105.7* 08/06/2015   PLT 258 08/06/2015      Chemistry      Component Value Date/Time   NA 137 04/21/2015 1538   NA 138 02/27/2015 1352   K 4.0 04/21/2015 1538   K 4.0 02/27/2015 1352   CL 104 02/27/2015 1352   CO2 22 04/21/2015 1538   CO2 26 02/27/2015 1352   BUN 9.9 04/21/2015 1538   BUN 9 02/27/2015 1352   CREATININE 0.7 04/21/2015 1538   CREATININE 1.05* 02/27/2015 1352      Component Value Date/Time   CALCIUM 9.7 04/21/2015 1538   CALCIUM 8.8* 02/27/2015 1352   ALKPHOS 80 04/21/2015 1538   AST 25 04/21/2015 1538   ALT 16 04/21/2015 1538   BILITOT 0.96 04/21/2015 1538       No results found for: LABCA2  No components found for: EPPIR518  No results for input(s): INR in the last 168 hours.  Urinalysis No results found for: COLORURINE, APPEARANCEUR, LABSPEC, PHURINE, GLUCOSEU, HGBUR, BILIRUBINUR, KETONESUR, PROTEINUR, UROBILINOGEN, NITRITE, LEUKOCYTESUR  STUDIES:  repeat mammography due early June 2017  ASSESSMENT: 81 y.o. [name] woman  (1) status post left lumpectomy and sentinel lymph node sampling 1996 for a [place] NX M0 invasive breast cancer (no further staging data unavailable)  (a) status post adjuvant radiation  (b) status post tamoxifen 5 years   (2) status post right breast biopsy 02/11/2015 for a clinical T1a N0, stage IA invasive lobular carcinoma, strongly estrogen receptor positive, progesterone receptor 5% positive, with an MIB-1 of 10%, and no HER-2 amplification  (3) status post right lumpectomy and sentinel lymph  node sampling 03/04/2015 for a pTX pN), stage IA area of lobular neoplasia, with no evidence of invasion; margins were clear  (4) anastrozole started 04/21/2015    PLAN:  Theresa Lutz is tolerating the anastrozole essentially without any side effects and obtaining an adequate price. The plan will be to continue that for 5 years.   the main concern with this drug is worsening osteopenia/osteoporosis. She has all her screening studies in [place] and will be discussing bone density  issues with her primary care physician at their next visit.   She did not tolerate the hydrocodone that Dr. Jimmye Norman prescribed for her (I have added to her list of intolerances) and she is afraid Percocet will be the same thing. I suggested she try Aleve for now, up to 3 times a day as needed, with food. If that does not work she can try tramadol and we can certainly call that prescription in for her if she wishes.   Otherwise she has an excellent prognosis as far as breast cancer is concerned. She will see [place] again in 6 months. If all continues to Lutz well at that visit we will start seeing her on a once a year basis thereafter Chauncey Cruel, MD   08/06/2015 12:18 PM Medical Oncology and Hematology [facility] [place], [place] [address] Tel. [phone]    Fax. [phone]

## 2015-08-07 LAB — VITAMIN D 25 HYDROXY (VIT D DEFICIENCY, FRACTURES): Vit D, 25-Hydroxy: 38 ng/mL (ref 30–100)

## 2016-01-12 ENCOUNTER — Other Ambulatory Visit: Payer: Self-pay | Admitting: Nurse Practitioner

## 2016-03-02 ENCOUNTER — Ambulatory Visit: Payer: Medicare PPO | Admitting: Nurse Practitioner

## 2016-03-02 ENCOUNTER — Other Ambulatory Visit: Payer: Medicare PPO

## 2016-03-09 ENCOUNTER — Ambulatory Visit (INDEPENDENT_AMBULATORY_CARE_PROVIDER_SITE_OTHER): Payer: Medicare Other | Admitting: Allergy and Immunology

## 2016-03-09 ENCOUNTER — Encounter: Payer: Self-pay | Admitting: Allergy and Immunology

## 2016-03-09 VITALS — BP 130/90 | HR 88 | Temp 98.1°F | Resp 18 | Ht 62.6 in | Wt 110.2 lb

## 2016-03-09 DIAGNOSIS — Z91018 Allergy to other foods: Secondary | ICD-10-CM

## 2016-03-09 DIAGNOSIS — T782XXA Anaphylactic shock, unspecified, initial encounter: Secondary | ICD-10-CM | POA: Diagnosis not present

## 2016-03-09 NOTE — Progress Notes (Signed)
Dear Dr. Owens Shark,  Thank you for referring Theresa Lutz to the [facility] on 03/09/2016.   Below is a summation of this patient's evaluation and recommendations.  Thank you for your referral. I will keep you informed about this patient's response to treatment.   If you have any questions please to do hestitate to contact me.   Sincerely,  Jiles Prows, MD [place] [facility]   ______________________________________________________________________    NEW PATIENT NOTE  Referring Provider: Rosario Adie, MD Primary Provider: [name], MD Date of office visit: 03/09/2016    Subjective:   Chief Complaint:  Theresa Lutz (DOB: [birth year]) is a 82 y.o. female with a chief complaint of Urticaria; throat closing; and Angioedema  who presents to the clinic on 03/09/2016 with the following problems:  HPI: Asjha presents this clinic in evaluation of a allergic reaction that occurred on 02/09/2016. Apparently while shopping at [facility] around 4:30 PM she started to develop diffuse itchiness and then diffuse urticaria for which she drove home and took a shower only to develop giant urticarial lesions across her body requiring her to get evaluation with Dr. Owens Shark at the urgent care center. At that point time she had associated tightness of her mouth and throat. She was given multiple injections including what sounds like epinephrine and a systemic steroid and a antihistamine and she was better within approximately 90 minutes. Everything resolved within several hours. She never had any other associated systemic or constitutional symptoms. There was no obvious provoking factor giving rise to this reaction. However, it should be noted that she ate a hamburger around 3-4 hours prior to this reaction which is an unusual meal for her to consume. She has not eaten any mammal since that point in time. She has had  exposure to multiple tick bites in the past 12 encounter months.  Past Medical History  Diagnosis Date  . Cancer ([place])     Left Br  . Bruises easily   . Breast cancer, right breast ([place])   . Angio-edema   . Urticaria   . Breast CA [facility])     Past Surgical History  Procedure Laterality Date  . Breast surgery  10/04/1994     partial left breast mastectomy  . Appendectomy    . Breast lumpectomy with radioactive seed and sentinel lymph node biopsy Right 03/04/2015    Procedure: RIGHT RADIOACTIVE SEED GUIDED BREAST LUMPECTOMY AND RIGHT AXILLARY SENTINEL LYMPH NODE BIOPSY;  Surgeon: [name], MD;  Location: [place];  Service: General;  Laterality: Right;  . Cataract extraction        Medication List           acetaminophen 325 MG tablet  Commonly known as:  TYLENOL  Take 650 mg by mouth every 6 (six) hours as needed.     anastrozole 1 MG tablet  Commonly known as:  ARIMIDEX  Take 1 tablet (1 mg total) by mouth daily.     CALCIUM & VIT D3 BONE HEALTH PO  Take by mouth.     cephALEXin 500 MG capsule  Commonly known as:  KEFLEX     naproxen sodium 220 MG tablet  Commonly known as:  ALEVE  Take 1 tablet (220 mg total) by mouth 3 (three) times daily with meals as needed.     phenazopyridine 200 MG tablet  Commonly known as:  PYRIDIUM  Take 200  mg by mouth 3 (three) times daily as needed for pain.        Allergies  Allergen Reactions  . Hydrocodone Nausea And Vomiting    Review of systems negative except as noted in HPI / PMHx or noted below:  Review of Systems  Constitutional: Negative.        Has lost several pounds of weight in the past month. Has no appetite. Not unusual for her to oscillate weight between 109 and 112.  HENT: Negative.   Eyes: Negative.   Respiratory: Negative.   Cardiovascular: Negative.   Gastrointestinal: Negative.   Genitourinary: Negative.        Diagnosed with urinary tract infection this morning treated with medications at urgent  care center  Musculoskeletal: Negative.   Skin: Negative.        Removal of basal cell carcinoma by Dr. Jimmye Norman recently right nasolabial fold  Neurological: Negative.   Endo/Heme/Allergies: Negative.   Psychiatric/Behavioral: Negative.     Family History  Problem Relation Age of Onset  . Lung cancer Father   . Stroke Mother     Social History   Social History  . Marital Status: Widowed    Spouse Name: N/A  . Number of Children: 5  . Years of Education: N/A   Occupational History  . Not on file.   Social History Main Topics  . Smoking status: Never Smoker   . Smokeless tobacco: Never Used  . Alcohol Use: Yes     Comment: rarely  . Drug Use: No  . Sexual Activity: Not on file   Other Topics Concern  . Not on file   Social History Narrative    Environmental and Social history  Lives in a house with a dry environment, 2 dogs located inside the household, carpeting in the bedroom, no plastic on the better pillow, and no smoking ongoing with inside the household.   Objective:   Filed Vitals:   03/09/16 1400  BP: 130/90  Pulse: 88  Temp: 98.1 F (36.7 C)  Resp: 18   Height: 5' 2.6" (159 cm) Weight: 110 lb 3.7 oz (50 kg)  Physical Exam  Constitutional: She is well-developed, well-nourished, and in no distress.  HENT:  Head: Normocephalic. Head is without right periorbital erythema and without left periorbital erythema.  Right Ear: Tympanic membrane, external ear and ear canal normal.  Left Ear: Tympanic membrane, external ear and ear canal normal.  Nose: Nose normal. No mucosal edema or rhinorrhea.  Mouth/Throat: Oropharynx is clear and moist and mucous membranes are normal. No oropharyngeal exudate.  Eyes: Conjunctivae and lids are normal. Pupils are equal, round, and reactive to light.  Neck: Trachea normal. No tracheal deviation present. No thyromegaly present.  Cardiovascular: Normal rate, regular rhythm, S1 normal, S2 normal and normal heart sounds.     No murmur heard. Pulmonary/Chest: Effort normal. No stridor. No tachypnea. No respiratory distress. She has no wheezes. She has no rales. She exhibits no tenderness.  Abdominal: Soft. She exhibits no distension and no mass. There is no hepatosplenomegaly. There is no tenderness. There is no rebound and no guarding.  Musculoskeletal: She exhibits no edema or tenderness.  Lymphadenopathy:       Head (right side): No tonsillar adenopathy present.       Head (left side): No tonsillar adenopathy present.    She has no cervical adenopathy.    She has no axillary adenopathy.  Neurological: She is alert. Gait normal.  Skin: Rash (Bandage right nasolabial  fold) noted. She is not diaphoretic. No erythema. No pallor. Nails show no clubbing.  Psychiatric: Mood and affect normal.     Diagnostics: Allergy skin tests were performed. She demonstrated hypersensitivity to pork, beef, Lamb, salmon, and shellfish mix   Assessment and Plan:    1. Anaphylaxis, initial encounter   2. Food allergy     1. Allergen avoidance measures  2. EpiPen, Benadryl, M.D./ER evaluation for allergic reaction  3. Blood - CBC with differential, alpha gal panel, fish/shellfish panel  4. Further evaluation? Yes if recurrent  Taniqua's history is consistent with alpha gal syndrome and we will be confirming that issue by checking her alpha gal IgE titer as well as working through her possible fish and shellfish allergy. I did give her an EpiPen to use should she ever have a severe allergic reaction following inverted administration of specific food products.  I will contact her with the results of her blood tests once they're available for review and she will contact me should she develop another allergic reaction in the future  Jiles Prows, MD [place] [facility]

## 2016-03-09 NOTE — Patient Instructions (Addendum)
  1. Allergen avoidance measures  2. EpiPen, Benadryl, M.D./ER evaluation for allergic reaction  3. Blood - CBC with differential, alpha gal panel, fish/shellfish panel  4. Further evaluation? Yes if recurrent

## 2016-03-13 LAB — ALLERGEN PROFILE, FOOD-FISH
Allergen Mackerel IgE: <0.1 kU/L
Allergen Salmon IgE: 0.2 kU/L — AB
Allergen Walley Pike IgE: <0.1 kU/L
Halibut IgE: <0.1 kU/L
Tuna: <0.1 kU/L

## 2016-03-13 LAB — CBC WITH DIFFERENTIAL/PLATELET
BASOS ABS: 0 10*3/uL (ref 0.0–0.2)
BASOS: 0 %
EOS (ABSOLUTE): 0.1 10*3/uL (ref 0.0–0.4)
EOS: 1 %
Hematocrit: 45.7 % (ref 34.0–46.6)
Hemoglobin: 14.9 g/dL (ref 11.1–15.9)
IMMATURE GRANS (ABS): 0 10*3/uL (ref 0.0–0.1)
Immature Granulocytes: 0 %
LYMPHS: 16 %
Lymphocytes Absolute: 1.6 10*3/uL (ref 0.7–3.1)
MCH: 33.1 pg — ABNORMAL HIGH (ref 26.6–33.0)
MCHC: 32.6 g/dL (ref 31.5–35.7)
MCV: 102 fL — AB (ref 79–97)
MONOCYTES: 10 %
Monocytes Absolute: 1 10*3/uL — ABNORMAL HIGH (ref 0.1–0.9)
NEUTROS ABS: 6.8 10*3/uL (ref 1.4–7.0)
Neutrophils: 73 %
Platelets: 277 10*3/uL (ref 150–379)
RBC: 4.5 x10E6/uL (ref 3.77–5.28)
RDW: 13.1 % (ref 12.3–15.4)
WBC: 9.6 10*3/uL (ref 3.4–10.8)

## 2016-03-13 LAB — ALLERGEN PROFILE, SHELLFISH
F080-IgE Lobster: <0.1 kU/L
F290-IGE OYSTER: 0.15 kU/L — AB
SHRIMP IGE: 0.13 kU/L — AB

## 2016-03-13 LAB — ALPHA-GAL PANEL
Alpha Gal IgE*: 30.8 kU/L — ABNORMAL HIGH (ref <0.35)
BEEF (BOS SPP) IGE: 11.1 kU/L — AB (ref <0.35)
Class Interpretation: 3
Class Interpretation: 3
Class Interpretation: 3
Lamb/Mutton (Ovis spp) IgE: 4.65 kU/L — ABNORMAL HIGH (ref <0.35)
Pork (Sus spp) IgE: 7.64 kU/L — ABNORMAL HIGH (ref <0.35)

## 2016-03-14 ENCOUNTER — Telehealth: Payer: Self-pay | Admitting: *Deleted

## 2016-03-14 NOTE — Telephone Encounter (Signed)
-----   Message from Jiles Prows, MD sent at 03/14/2016  7:35 AM EDT ----- Please informed patient that blood tests confirm alpha gal syndrome and she should avoid all mammal consumption at this point in time. She also had very low titers of antibodies against shrimp, oyster, and salmon. Need to be careful about eating these foods and be prepared to recognize and treat an allergic reaction. Can arrange for an in clinic food challenge for fish and shellfish if desired.

## 2016-03-14 NOTE — Telephone Encounter (Signed)
Informed patient of results. Instructed her to avoid all mammal consumption. She states she had shrimp about 2 weeks ago. She doesn't eat a lot of shrimp but loves salmon. Ask patient if she would like to have a in office challenge for salmon and at this point she is not interested. Informed patient that if she was going to eat salmon to be sure to have her EpiPen available since she does not want to do a challenge. She will call us back if she changes her mind.

## 2016-04-11 ENCOUNTER — Other Ambulatory Visit: Payer: Self-pay | Admitting: *Deleted

## 2016-04-11 DIAGNOSIS — C50211 Malignant neoplasm of upper-inner quadrant of right female breast: Secondary | ICD-10-CM

## 2016-04-12 ENCOUNTER — Telehealth: Payer: Self-pay | Admitting: Oncology

## 2016-04-12 ENCOUNTER — Other Ambulatory Visit (HOSPITAL_BASED_OUTPATIENT_CLINIC_OR_DEPARTMENT_OTHER): Payer: Medicare Other

## 2016-04-12 ENCOUNTER — Ambulatory Visit (HOSPITAL_BASED_OUTPATIENT_CLINIC_OR_DEPARTMENT_OTHER): Payer: Medicare Other | Admitting: Oncology

## 2016-04-12 VITALS — BP 137/78 | HR 76 | Temp 98.4°F | Resp 18 | Ht 62.5 in | Wt 108.7 lb

## 2016-04-12 DIAGNOSIS — C50211 Malignant neoplasm of upper-inner quadrant of right female breast: Secondary | ICD-10-CM

## 2016-04-12 DIAGNOSIS — Z853 Personal history of malignant neoplasm of breast: Secondary | ICD-10-CM

## 2016-04-12 DIAGNOSIS — C50912 Malignant neoplasm of unspecified site of left female breast: Secondary | ICD-10-CM

## 2016-04-12 DIAGNOSIS — M859 Disorder of bone density and structure, unspecified: Secondary | ICD-10-CM

## 2016-04-12 LAB — CBC WITH DIFFERENTIAL/PLATELET
BASO%: 1.2 % (ref 0.0–2.0)
Basophils Absolute: 0.1 10*3/uL (ref 0.0–0.1)
EOS%: 1.1 % (ref 0.0–7.0)
Eosinophils Absolute: 0.1 10*3/uL (ref 0.0–0.5)
HCT: 45.5 % (ref 34.8–46.6)
HEMOGLOBIN: 15 g/dL (ref 11.6–15.9)
LYMPH%: 26.6 % (ref 14.0–49.7)
MCH: 34.3 pg — ABNORMAL HIGH (ref 25.1–34.0)
MCHC: 32.9 g/dL (ref 31.5–36.0)
MCV: 104.4 fL — ABNORMAL HIGH (ref 79.5–101.0)
MONO#: 0.7 10*3/uL (ref 0.1–0.9)
MONO%: 13.5 % (ref 0.0–14.0)
NEUT%: 57.6 % (ref 38.4–76.8)
NEUTROS ABS: 2.9 10*3/uL (ref 1.5–6.5)
Platelets: 259 10*3/uL (ref 145–400)
RBC: 4.36 10*6/uL (ref 3.70–5.45)
RDW: 14.3 % (ref 11.2–14.5)
WBC: 5 10*3/uL (ref 3.9–10.3)
lymph#: 1.3 10*3/uL (ref 0.9–3.3)

## 2016-04-12 LAB — COMPREHENSIVE METABOLIC PANEL
ALT: 17 U/L (ref 0–55)
ANION GAP: 8 meq/L (ref 3–11)
AST: 28 U/L (ref 5–34)
Albumin: 3.6 g/dL (ref 3.5–5.0)
Alkaline Phosphatase: 78 U/L (ref 40–150)
BILIRUBIN TOTAL: 1.18 mg/dL (ref 0.20–1.20)
BUN: 5.8 mg/dL — AB (ref 7.0–26.0)
CHLORIDE: 105 meq/L (ref 98–109)
CO2: 26 meq/L (ref 22–29)
CREATININE: 0.7 mg/dL (ref 0.6–1.1)
Calcium: 9.5 mg/dL (ref 8.4–10.4)
EGFR: 83 mL/min/{1.73_m2} — ABNORMAL LOW (ref >90)
GLUCOSE: 98 mg/dL (ref 70–140)
Potassium: 3.7 mEq/L (ref 3.5–5.1)
SODIUM: 139 meq/L (ref 136–145)
TOTAL PROTEIN: 6.7 g/dL (ref 6.4–8.3)

## 2016-04-12 MED ORDER — ANASTROZOLE 1 MG PO TABS: 1.0000 mg | ORAL_TABLET | Freq: Every day | ORAL | 4 refills | Status: DC

## 2016-04-12 NOTE — Progress Notes (Signed)
[facility]  Telephone:([phone]) [phone] [phone]) [phone]     ID: [name] DOB: 12/20/1932  MR#: [id number]  HYQ#:[id number]  Patient Care Team: Theresa Roger, MD as PCP - General (Specialist) Theresa Cruel, MD as Consulting Physician (Oncology) Theresa Mesa, MD as Consulting Physician (General Surgery) Theresa Pray, MD as Consulting Physician (Radiation Oncology) PCP: Theresa Roger, MD OTHER MD: Theresa Lutz M.D. (dermatology )  CHIEF COMPLAINT: Lobular breast cancer  CURRENT TREATMENT: Anastrozole   BREAST CANCER HISTORY:  from the earlier summary note:  Theresa Lutz is an 82 year old [name] woman I saw originally in 1996 for a left-sided breast cancer. Unfortunately the data from that tumor is not retrievable: I don't have size of primary or number of lymph nodes removed from the left axilla. The patient did receive adjuvant radiation and then took tamoxifen for 5 years. She was then discharged from follow-up.  On 01/21/2014 she had bilateral screening mammography at [facility] showing a possible mass in the right breast. Right diagnostic mammography with ultrasonography 01/29/2014 found the breast density to be category B. In the upper retroareolar right breast there was a circumscribed mass which was not palpable by exam. Ultrasound showed this to be oval-shaped and to measure 0.6 mm. There was no internal color Doppler flow. This was felt to be a benign cyst.  On 01/22/2015 bilateral screening mammography showed a possible mass in the right breast. On 02/09/2015 the patient underwent right diagnostic mammography with right breast ultrasonography. This found a mass in the "inner breast" measuring approximately 4 mm. It was not palpable. Targeted ultrasound found a hypoechoic mass with irregular margins in the upper inner quadrant measuring 4 mm, with peripheral Doppler flow. The previously identified cyst was noted again at 11:30 o'clock, unchanged. The  right axilla showed no pathologic lymphadenopathy. The lymph node in the low axilla had a upper normal cortical thickness at 3 mm but a normal fatty hilus.  Accordingly on 02/11/2015 biopsy of the right breast mass in question showed ([id number]) an invasive and in situ lobular breast cancer, E-cadherin negative, estrogen receptor 90% positive, progesterone receptor 5% positive, both with strong staining intensity, with the MIB-1 at 10% and no HER-2 amplification, the signals ratio being 1.05 and the number per cell 2.05.  On 03/03/2015 the patient had radioactive seed placement and on 03/04/2015 underwent right lumpectomy with right axillary sentinel lymph node biopsy. The pathology from this procedure ([id number]) found lobular carcinoma in situ and atypical lobular hyperplasia in a 0.8 cm mass, with surgical margins negative for atypia or malignancy. The single sentinel lymph node was negative  The patient's subsequent history is as detailed below  INTERVAL HISTORY: Theresa Lutz  returns today for follow-up of her estrogen receptor positive breast cancer. She is tolerating anastrozole well,   Hot flashes and vaginal dryness are not a major issue. She never developed the arthralgias or myalgias that many patients can experience on this medication. She obtains it at a good price.  [name] Kitchen REVIEW OF SYSTEMS: She is very busy with her home dogs and family. She had some bladder issues which have resolved and his skin cancer removed. Also she tells me she has been diagnosed to have alpha gall allergy which is being treating by "not eating mammals". Aside from these problems a detailed review of systems today was noncontributory   PAST MEDICAL HISTORY: Past Medical History:  Diagnosis Date  . Angio-edema   . Breast CA ([place])   . Breast  cancer, right breast ([place])   . Bruises easily   . Cancer ([place])    Left Br  . Urticaria     PAST SURGICAL HISTORY: Past Surgical History:  Procedure Laterality Date  .  APPENDECTOMY    . BREAST LUMPECTOMY WITH RADIOACTIVE SEED AND SENTINEL LYMPH NODE BIOPSY Right 03/04/2015   Procedure: RIGHT RADIOACTIVE SEED GUIDED BREAST LUMPECTOMY AND RIGHT AXILLARY SENTINEL LYMPH NODE BIOPSY;  Surgeon: Theresa Mesa, MD;  Location: [place];  Service: General;  Laterality: Right;  . BREAST SURGERY  10/04/1994    partial left breast mastectomy  . CATARACT EXTRACTION      FAMILY HISTORY Family History  Problem Relation Age of Onset  . Lung cancer Theresa Lutz   . Stroke Theresa Lutz    The patient's Theresa Lutz died from lung cancer at the age of 69, in the setting of tobacco abuse. The patient's Theresa Lutz lived to be 58. Theresa Lutz was an only child. She has a first cousin diagnosed with breast cancer after the age of 91, but the patient does not recall the exact age of diagnosis. The patient's paternal grandmother may have had ovarian cancer.  GYNECOLOGIC HISTORY:  No LMP recorded. Patient is postmenopausal. Menarche age 20 first live birth age 39. The patient is GX P5. Menopause in her late 14s. She did not take hormone replacement. 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 She is a widower and lives with her daughter Theresa Lutz. 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 The other children are Theresa Lutz who lives in [place] and works as a Marine scientist; 86 Loc who lives in [place] and works as a Armed forces training and education officer, [name] lives in [place] and works as a Art therapist and [name] lives in [place] and works for the [employer]. The patient has 9 grandchildren. She is a Theresa Lutz: In place; the patient has named 3 all her children jointly Theresa Lutz, Theresa Lutz, and Theresa Lutz) as healthcare powers of attorney   HEALTH MAINTENANCE: Social History  Substance Use Topics  .  Smoking status: Never Smoker  . Smokeless tobacco: Never Used  . Alcohol use Yes     Comment: rarely     Colonoscopy: 2015/[place]  PAP: 2006?   Bone density: 2015/normal  Lipid panel:  Allergies  Allergen Reactions  . Hydrocodone Nausea And Vomiting    Current Outpatient Prescriptions  Medication Sig Dispense Refill  . anastrozole (ARIMIDEX) 1 MG tablet Take 1 tablet (1 mg total) by mouth daily. 90 tablet 4  . Multiple Minerals-Vitamins (CALCIUM & VIT D3 BONE HEALTH PO) Take by mouth.     No current facility-administered medications for this visit.     OBJECTIVE: Older white woman Who appears well Vitals:   04/12/16 1056  BP: 137/78  Pulse: 76  Resp: 18  Temp: 98.4 F (36.9 C)     Body mass index is 19.56 kg/m.    ECOG FS:0 - Asymptomatic  Sclerae unicteric, pupils round and equal Oropharynx clear and moist-- no thrush or other lesions No cervical or supraclavicular adenopathy Lungs no rales or rhonchi Heart regular rate and rhythm Abd soft, nontender, positive bowel sounds MSK no focal spinal tenderness, no upper extremity lymphedema , grade 1 right ankle edema Neuro: nonfocal, well oriented, appropriate affect Breasts:  The right  breast is status post lumpectomy. There is no evidence of local recurrence. The right axilla is benign.. The left breast is status post lumpectomy. There is no evidence of local recurrence. Left axilla is benign.   LAB RESULTS:  CMP     Component Value Date/Time   NA 139 08/06/2015 1131   K 4.1 08/06/2015 1131   CL 104 02/27/2015 1352   CO2 27 08/06/2015 1131   GLUCOSE 94 08/06/2015 1131   BUN 5.4 (L) 08/06/2015 1131   CREATININE 0.7 08/06/2015 1131   CALCIUM 9.7 08/06/2015 1131   PROT 6.8 08/06/2015 1131   ALBUMIN 3.8 08/06/2015 1131   AST 22 08/06/2015 1131   ALT 10 08/06/2015 1131   ALKPHOS 68 08/06/2015 1131   BILITOT 1.27 (H) 08/06/2015 1131   GFRNONAA 49 (L) 02/27/2015 1352   GFRAA 57 (L) 02/27/2015 1352    INo  results found for: SPEP, UPEP  Lab Results  Component Value Date   WBC 5.0 04/12/2016   NEUTROABS 2.9 04/12/2016   HGB 15.0 04/12/2016   HCT 45.5 04/12/2016   MCV 104.4 (H) 04/12/2016   PLT 259 04/12/2016      Chemistry      Component Value Date/Time   NA 139 08/06/2015 1131   K 4.1 08/06/2015 1131   CL 104 02/27/2015 1352   CO2 27 08/06/2015 1131   BUN 5.4 (L) 08/06/2015 1131   CREATININE 0.7 08/06/2015 1131      Component Value Date/Time   CALCIUM 9.7 08/06/2015 1131   ALKPHOS 68 08/06/2015 1131   AST 22 08/06/2015 1131   ALT 10 08/06/2015 1131   BILITOT 1.27 (H) 08/06/2015 1131       No results found for: LABCA2  No components found for: LABCA125  No results for input(s): INR in the last 168 hours.  Urinalysis No results found for: COLORURINE, APPEARANCEUR, LABSPEC, [place], GLUCOSEU, HGBUR, BILIRUBINUR, KETONESUR, PROTEINUR, UROBILINOGEN, NITRITE, LEUKOCYTESUR  STUDIES: Mammogram in [place] 03/28/2016 was benign.  ASSESSMENT: 82 y.o. [name] woman  (1) status post left lumpectomy and sentinel lymph node sampling 1996 for a [place] NX M0 invasive breast cancer (no further staging data unavailable)  (a) status post adjuvant radiation  (b) status post tamoxifen 5 years   (2) status post right breast biopsy 02/11/2015 for a clinical T1a N0, stage IA invasive lobular carcinoma, strongly estrogen receptor positive, progesterone receptor 5% positive, with an MIB-1 of 10%, and no HER-2 amplification  (3) status post right lumpectomy and sentinel lymph node sampling 03/04/2015 for a pTX pN), stage IA area of lobular neoplasia, with no evidence of invasion; margins were clear  (4) anastrozole started 04/21/2015    PLAN:  Theresa Lutz is Now a year out from definitive surgery for her more recent breast cancer with no evidence of disease recurrence. This is favorable.  She is tolerating the anastrozole without any unusual side effects. The plan is to continue that an  additional 4 years.  She had she thinks a bone density within the last 2 years. We will try to obtain those records from her primary care physician's office.  Otherwise she will return to see me again in one year, after her next mammogram. She knows to call for any problems that may develop before that visit.  Theresa Cruel, MD   04/12/2016 11:23 AM Medical Oncology and Hematology [facility] [address] [place], [place] [address] Tel. [phone]    Fax. [phone]

## 2016-04-12 NOTE — Telephone Encounter (Signed)
appt made and avs printed °

## 2017-04-11 ENCOUNTER — Other Ambulatory Visit: Payer: Self-pay | Admitting: Emergency Medicine

## 2017-04-11 DIAGNOSIS — C50211 Malignant neoplasm of upper-inner quadrant of right female breast: Secondary | ICD-10-CM

## 2017-04-12 ENCOUNTER — Ambulatory Visit (HOSPITAL_BASED_OUTPATIENT_CLINIC_OR_DEPARTMENT_OTHER): Payer: Medicare Other | Admitting: Oncology

## 2017-04-12 ENCOUNTER — Other Ambulatory Visit (HOSPITAL_BASED_OUTPATIENT_CLINIC_OR_DEPARTMENT_OTHER): Payer: Medicare Other

## 2017-04-12 VITALS — BP 145/81 | HR 66 | Temp 98.2°F | Resp 18 | Ht 62.5 in | Wt 109.6 lb

## 2017-04-12 DIAGNOSIS — Z170 Estrogen receptor positive status [ER+]: Secondary | ICD-10-CM | POA: Diagnosis not present

## 2017-04-12 DIAGNOSIS — C50211 Malignant neoplasm of upper-inner quadrant of right female breast: Secondary | ICD-10-CM

## 2017-04-12 DIAGNOSIS — C50812 Malignant neoplasm of overlapping sites of left female breast: Secondary | ICD-10-CM

## 2017-04-12 DIAGNOSIS — Z853 Personal history of malignant neoplasm of breast: Secondary | ICD-10-CM

## 2017-04-12 LAB — COMPREHENSIVE METABOLIC PANEL
ALT: 16 U/L (ref 0–55)
ANION GAP: 11 meq/L (ref 3–11)
AST: 30 U/L (ref 5–34)
Albumin: 3.6 g/dL (ref 3.5–5.0)
Alkaline Phosphatase: 81 U/L (ref 40–150)
BUN: 9.2 mg/dL (ref 7.0–26.0)
CHLORIDE: 103 meq/L (ref 98–109)
CO2: 25 meq/L (ref 22–29)
CREATININE: 0.7 mg/dL (ref 0.6–1.1)
Calcium: 9.4 mg/dL (ref 8.4–10.4)
EGFR: 81 mL/min/{1.73_m2} — AB (ref >90)
GLUCOSE: 83 mg/dL (ref 70–140)
Potassium: 4 mEq/L (ref 3.5–5.1)
SODIUM: 138 meq/L (ref 136–145)
Total Bilirubin: 1.44 mg/dL — ABNORMAL HIGH (ref 0.20–1.20)
Total Protein: 6.4 g/dL (ref 6.4–8.3)

## 2017-04-12 LAB — CBC WITH DIFFERENTIAL/PLATELET
BASO%: 1 % (ref 0.0–2.0)
Basophils Absolute: 0.1 10*3/uL (ref 0.0–0.1)
EOS%: 0.3 % (ref 0.0–7.0)
Eosinophils Absolute: 0 10*3/uL (ref 0.0–0.5)
HCT: 44 % (ref 34.8–46.6)
HGB: 14.7 g/dL (ref 11.6–15.9)
LYMPH%: 24.3 % (ref 14.0–49.7)
MCH: 35.8 pg — ABNORMAL HIGH (ref 25.1–34.0)
MCHC: 33.4 g/dL (ref 31.5–36.0)
MCV: 107.1 fL — ABNORMAL HIGH (ref 79.5–101.0)
MONO#: 0.7 10*3/uL (ref 0.1–0.9)
MONO%: 10.7 % (ref 0.0–14.0)
NEUT#: 3.9 10*3/uL (ref 1.5–6.5)
NEUT%: 63.7 % (ref 38.4–76.8)
PLATELETS: 209 10*3/uL (ref 145–400)
RBC: 4.11 10*6/uL (ref 3.70–5.45)
RDW: 12.7 % (ref 11.2–14.5)
WBC: 6.1 10*3/uL (ref 3.9–10.3)
lymph#: 1.5 10*3/uL (ref 0.9–3.3)

## 2017-04-12 NOTE — Progress Notes (Signed)
[place]  [phone] [phone]     ID: TERISSA HAFFEY DOB: 1932/10/09  MR#: [id number]  KXF#:[id number]  Patient Care Team: [name], MD as PCP - General (Specialist) [name], [name], MD as Consulting Physician (Oncology) [name], MD as Consulting Physician (General Surgery) [name], MD as Consulting Physician (Radiation Oncology) PCP: [name], [name], MD OTHER MD: [name]  CHIEF COMPLAINT: Lobular breast cancer  CURRENT TREATMENT: Anastrozole   BREAST CANCER HISTORY:  from the earlier summary note:  Jaryn Hocutt is an 83 year old [place] woman I saw originally in 1996 for a left-sided breast cancer. Unfortunately the data from that tumor is not retrievable: I don't have size of primary or number of lymph nodes removed from the left axilla. The patient did receive adjuvant radiation and then took tamoxifen for 5 years. She was then discharged from follow-up.  On 01/21/2014 she had bilateral screening mammography at [facility] showing a possible mass in the right breast. Right diagnostic mammography with ultrasonography 01/29/2014 found the breast density to be category B. In the upper retroareolar right breast there was a circumscribed mass which was not palpable by exam. Ultrasound showed this to be oval-shaped and to measure 0.6 mm. There was no internal color Doppler flow. This was felt to be a benign cyst.  On 01/22/2015 bilateral screening mammography showed a possible mass in the right breast. On 02/09/2015 the patient underwent right diagnostic mammography with right breast ultrasonography. This found a mass in the "inner breast" measuring approximately 4 mm. It was not palpable. Targeted ultrasound found a hypoechoic mass with irregular margins in the upper inner quadrant measuring 4 mm, with peripheral Doppler flow. The previously identified cyst was noted again at 11:30 o'clock, unchanged.  The right axilla showed no pathologic lymphadenopathy. The lymph node in the low axilla had a upper normal cortical thickness at 3 mm but a normal fatty hilus.  Accordingly on 02/11/2015 biopsy of the right breast mass in question showed ([id number]) an invasive and in situ lobular breast cancer, E-cadherin negative, estrogen receptor 90% positive, progesterone receptor 5% positive, both with strong staining intensity, with the MIB-1 at 10% and no HER-2 amplification, the signals ratio being 1.05 and the number per cell 2.05.  On 03/03/2015 the patient had radioactive seed placement and on 03/04/2015 underwent right lumpectomy with right axillary sentinel lymph node biopsy. The pathology from this procedure ([id number]) found lobular carcinoma in situ and atypical lobular hyperplasia in a 0.8 cm mass, with surgical margins negative for atypia or malignancy. The single sentinel lymph node was negative  The patient's subsequent history is as detailed below  INTERVAL HISTORY: Arville Go returns today for follow-up and treatment of her estrogen receptor positive breast cancer. She continues on anastrozole, with excellent tolerance.Hot flashes and vaginal dryness are not a major issue. She never developed the arthralgias or myalgias that many patients can experience on this medication. She obtains it at a good price.  [name] Kitchen REVIEW OF SYSTEMS: She does tai chi frequently, walks her 2 dogs daily, and occasionally uses a stationary bike. She tells me she was diagnosed with alpha gal allergy by the allergist in [place] and this is radically changed her diet. A detailed review of systems today is otherwise stable  PAST MEDICAL HISTORY: Past Medical History:  Diagnosis Date  . Angio-edema   . Breast CA ([place])   . Breast cancer, right breast ([place])   . Bruises easily   .  Cancer ([place])    Left Br  . Urticaria     PAST SURGICAL HISTORY: Past Surgical History:  Procedure Laterality Date  . APPENDECTOMY    .  BREAST LUMPECTOMY WITH RADIOACTIVE SEED AND SENTINEL LYMPH NODE BIOPSY Right 03/04/2015   Procedure: RIGHT RADIOACTIVE SEED GUIDED BREAST LUMPECTOMY AND RIGHT AXILLARY SENTINEL LYMPH NODE BIOPSY;  Surgeon: [name], MD;  Location: [facility];  Service: General;  Laterality: Right;  . BREAST SURGERY  10/04/1994    partial left breast mastectomy  . CATARACT EXTRACTION      FAMILY HISTORY Family History  Problem Relation Age of Onset  . Lung cancer Father   . Stroke Mother    The patient's father died from lung cancer at the age of 71, in the setting of tobacco abuse. The patient's mother lived to be 14. [place] was an only child. She has a first cousin diagnosed with breast cancer after the age of 31, but the patient does not recall the exact age of diagnosis. The patient's paternal grandmother may have had ovarian cancer.  GYNECOLOGIC HISTORY:  No LMP recorded. Patient is postmenopausal. Menarche age 7 first live birth age 71. The patient is GX P5. Menopause in her late 34s. She did not take hormone replacement. 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 She is a widower and lives with her daughter [name]. [name] for a local nursing home. The other children are [name] who lives in [facility] and works as a Marine scientist; 2 Loc who lives in [place] and works as a Armed forces training and education officer, [name] lives in [place] and works as a Art therapist and [name] lives in [place] and works for the [employer]. The patient has 9 grandchildren. She is a [place]: In place; the patient has named 3 all her children jointly [name], [place], and [place]) as healthcare powers of attorney   HEALTH MAINTENANCE: Social History  Substance Use Topics  . Smoking status: Never  Smoker  . Smokeless tobacco: Never Used  . Alcohol use Yes     Comment: rarely     Colonoscopy: 2015/[facility]  PAP: 2006?   Bone density: 2015/normal  Lipid panel:  Allergies  Allergen Reactions  . Hydrocodone Nausea And Vomiting    Current Outpatient Prescriptions  Medication Sig Dispense Refill  . anastrozole (ARIMIDEX) 1 MG tablet Take 1 tablet (1 mg total) by mouth daily. 90 tablet 4  . Multiple Minerals-Vitamins (CALCIUM & VIT D3 BONE HEALTH PO) Take by mouth.     No current facility-administered medications for this visit.     OBJECTIVE: Older white woman In no acute distress  Vitals:   04/12/17 1338  BP: (!) 145/81  Pulse: 66  Resp: 18  Temp: 98.2 F (36.8 C)     Body mass index is 19.73 kg/m.    ECOG FS:0 - Asymptomatic  Sclerae unicteric, EOMs intact Oropharynx clear and moist No cervical or supraclavicular adenopathy Lungs no rales or rhonchi Heart regular rate and rhythm Abd soft, nontender, positive bowel sounds MSK no focal spinal tenderness, no upper extremity lymphedema Neuro: nonfocal, well oriented, appropriate affect Breasts: Both breasts are status post lumpectomy. There is no evidence of disease recurrence. Both axillae are benign.  LAB RESULTS:  CMP  Component Value Date/Time   NA 139 04/12/2016 1042   K 3.7 04/12/2016 1042   CL 104 02/27/2015 1352   CO2 26 04/12/2016 1042   GLUCOSE 98 04/12/2016 1042   BUN 5.8 (L) 04/12/2016 1042   CREATININE 0.7 04/12/2016 1042   CALCIUM 9.5 04/12/2016 1042   PROT 6.7 04/12/2016 1042   ALBUMIN 3.6 04/12/2016 1042   AST 28 04/12/2016 1042   ALT 17 04/12/2016 1042   ALKPHOS 78 04/12/2016 1042   BILITOT 1.18 04/12/2016 1042   GFRNONAA 49 (L) 02/27/2015 1352   GFRAA 57 (L) 02/27/2015 1352    INo results found for: SPEP, UPEP  Lab Results  Component Value Date   WBC 6.1 04/12/2017   NEUTROABS 3.9 04/12/2017   HGB 14.7 04/12/2017   HCT 44.0 04/12/2017   MCV 107.1 (H) 04/12/2017   PLT 209  04/12/2017      Chemistry      Component Value Date/Time   NA 139 04/12/2016 1042   K 3.7 04/12/2016 1042   CL 104 02/27/2015 1352   CO2 26 04/12/2016 1042   BUN 5.8 (L) 04/12/2016 1042   CREATININE 0.7 04/12/2016 1042      Component Value Date/Time   CALCIUM 9.5 04/12/2016 1042   ALKPHOS 78 04/12/2016 1042   AST 28 04/12/2016 1042   ALT 17 04/12/2016 1042   BILITOT 1.18 04/12/2016 1042       No results found for: LABCA2  No components found for: LABCA125  No results for input(s): INR in the last 168 hours.  Urinalysis No results found for: COLORURINE, APPEARANCEUR, LABSPEC, [place], GLUCOSEU, HGBUR, BILIRUBINUR, KETONESUR, PROTEINUR, UROBILINOGEN, NITRITE, LEUKOCYTESUR  STUDIES: Mammogram in [place] 03/28/2016 was benign.  ASSESSMENT: 83 y.o. [name] woman  (1) status post left lumpectomy and sentinel lymph node sampling 1996 for a [place] NX M0 invasive breast cancer (no further staging data unavailable)  (a) status post adjuvant radiation  (b) status post tamoxifen 5 years   (2) status post right breast biopsy 02/11/2015 for a clinical T1a N0, stage IA invasive lobular carcinoma, strongly estrogen receptor positive, progesterone receptor 5% positive, with an MIB-1 of 10%, and no HER-2 amplification  (3) status post right lumpectomy and sentinel lymph node sampling 03/04/2015 for a pTX pN), stage IA area of lobular neoplasia, with no evidence of invasion; margins were clear  (4) anastrozole started 04/21/2015  (a) bone density at [facility] ordered August 2018, results pending    PLAN:  Arville Go is now 2 years out from definitive surgery for her breast cancer with no evidence of disease recurrence. This is very favorable.  She is tolerating anastrozole well and the plan will be to continue that for total of 5 years.  We discussed the fact that the main concern with this drug is osteopenia/osteoporosis. I do not have record of a baseline bone density. I'm  setting her up for a bone density scan in [facility] within the next month or 2.  She is already on vitamin D and calcium supplementation. We will check a vitamin D level at the next visit, which will be in one year.  She knows to call for any problems that may develop before then.  Chauncey Cruel, MD   04/12/2017 1:54 PM Medical Oncology and Hematology [facility] [address] [place], [place] [address] Tel. [phone]    Fax. [phone]

## 2017-05-05 ENCOUNTER — Other Ambulatory Visit: Payer: Self-pay | Admitting: Oncology

## 2017-11-04 ENCOUNTER — Other Ambulatory Visit: Payer: Self-pay | Admitting: Oncology

## 2018-03-29 ENCOUNTER — Telehealth: Payer: Self-pay | Admitting: *Deleted

## 2018-03-29 NOTE — Telephone Encounter (Signed)
"  I have questions for Dr. Jana Hakim.    Did he receive Bone Density report from [facility] sent last week?  I was told I missed an appointment.  I have an appointment August 8 th.    Told I need Reclast intravenous treatments for osteoporosis because of the medication I'm on.  Is this correct the medication does this?  I have two more years to take Anastrazole.    I'm really concerned about the IV Reclast.  Do I really need this medication?"    Call transferred to collaborative extension for further assistance with this request.   Confirmed effect Anastrozole takes on bone production.   Only "Zometa" not "Reclast" is not used in our office.  Both medicines are brand names for the generic zoledronic acid and given intravenously.  No additional questions provided at this time.

## 2018-04-03 ENCOUNTER — Telehealth: Payer: Self-pay | Admitting: *Deleted

## 2018-04-03 NOTE — Telephone Encounter (Signed)
This RN returned pt's VM per her concern due to osteoporosis and primary MD recommending Reclast - and being on anastrozole.  Obtained number identified VM- message left stating call returned - may call again or discuss concerns at scheduled appointment with MD next week.

## 2018-04-09 NOTE — Progress Notes (Signed)
Theresa Lutz  [phone] Fax:([phone]) [phone]     ID: Theresa Lutz DOB: 01-Oct-1932  MR#: [id number]  RCV#:[id number]  Patient Care Team: Theresa Roger, MD as PCP - General (Specialist) [name], [name], MD as Consulting Physician (Oncology) Theresa Mesa, MD as Consulting Physician (General Surgery) Theresa Pray, MD as Consulting Physician (Radiation Oncology) PCP: Theresa Lutz, Theresa Cornea, MD OTHER MD: Theresa Lutz M.D. (dermatology )  CHIEF COMPLAINT: Lobular breast cancer  CURRENT TREATMENT: Anastrozole   BREAST CANCER HISTORY:  from the earlier summary note:  Theresa Lutz is an 84 year old [name] woman I saw originally in 1996 for a left-sided breast cancer. Unfortunately the data from that tumor is not retrievable: I don't have size of primary or number of lymph nodes removed from the left axilla. The patient did receive adjuvant radiation and then took tamoxifen for 5 years. She was then discharged from follow-up.  On 01/21/2014 she had bilateral screening mammography at [facility] showing a possible mass in the right breast. Right diagnostic mammography with ultrasonography 01/29/2014 found the breast density to be category B. In the upper retroareolar right breast there was a circumscribed mass which was not palpable by exam. Ultrasound showed this to be oval-shaped and to measure 0.6 mm. There was no internal color Doppler flow. This was felt to be a benign cyst.  On 01/22/2015 bilateral screening mammography showed a possible mass in the right breast. On 02/09/2015 the patient underwent right diagnostic mammography with right breast ultrasonography. This found a mass in the "inner breast" measuring approximately 4 mm. It was not palpable. Targeted ultrasound found a hypoechoic mass with irregular margins in the upper inner quadrant measuring 4 mm, with peripheral Doppler flow. The previously identified cyst was noted again at 11:30 o'clock, unchanged.  The right axilla showed no pathologic lymphadenopathy. The lymph node in the low axilla had a upper normal cortical thickness at 3 mm but a normal fatty hilus.  Accordingly on 02/11/2015 biopsy of the right breast mass in question showed ([id number]) an invasive and in situ lobular breast cancer, E-cadherin negative, estrogen receptor 90% positive, progesterone receptor 5% positive, both with strong staining intensity, with the MIB-1 at 10% and no HER-2 amplification, the signals ratio being 1.05 and the number per cell 2.05.  On 03/03/2015 the patient had radioactive seed placement and on 03/04/2015 underwent right lumpectomy with right axillary sentinel lymph node biopsy. The pathology from this procedure ([id number]) found lobular carcinoma in situ and atypical lobular hyperplasia in a 0.8 cm mass, with surgical margins negative for atypia or malignancy. The single sentinel lymph node was negative  The patient's subsequent history is as detailed below  INTERVAL HISTORY: Theresa Lutz returns today for follow-up and treatment of her estrogen receptor positive breast cancer. She is accompanied by her friend, Theresa Lutz today.   She continues on anastrozole, with good tolerance. She denies hot flashes or vaginal dryness.   Since her last visit to the office, she underwent a DEXA scan at [facility] on 02/19/2018 that showed: T-score of -2.5 at left femur neck, osteoporotic.   She was scheduled for Reclast at [facility] and made an appointment with her PCP, Theresa Lutz, to discuss the infusion prior to and was informed that she had osteoporosis. However, following this conversation, she cancelled her infusion appointment. She reports that her blood pressure has been elevated.  The whole osteoporosis, infusion issue has been difficult for her to deal with.  She wanted some clarification  today. [name] Kitchen REVIEW OF SYSTEMS: Theresa Lutz reports fatigue and she exercises to aid with this. As far as exercise, she  walks her two dogs twice a day (20 minutes each dog). She is in a Tai Chi class as well. She denies unusual headaches, visual changes, nausea, vomiting, or dizziness. There has been no unusual cough, phlegm production, or pleurisy. This been no change in bowel or bladder habits. She denies unexplained fatigue or unexplained weight loss, bleeding, rash, or fever. A detailed review of systems was otherwise stable.     PAST MEDICAL HISTORY: Past Medical History:  Diagnosis Date  . Angio-edema   . Breast CA ([place])   . Breast cancer, right breast ([place])   . Bruises easily   . Cancer ([place])    Left Br  . Urticaria     PAST SURGICAL HISTORY: Past Surgical History:  Procedure Laterality Date  . APPENDECTOMY    . BREAST LUMPECTOMY WITH RADIOACTIVE SEED AND SENTINEL LYMPH NODE BIOPSY Right 03/04/2015   Procedure: RIGHT RADIOACTIVE SEED GUIDED BREAST LUMPECTOMY AND RIGHT AXILLARY SENTINEL LYMPH NODE BIOPSY;  Surgeon: Theresa Mesa, MD;  Location: [facility];  Service: General;  Laterality: Right;  . BREAST SURGERY  10/04/1994    partial left breast mastectomy  . CATARACT EXTRACTION      FAMILY HISTORY Family History  Problem Relation Age of Onset  . Lung cancer Father   . Stroke Mother    The patient's father died from lung cancer at the age of 69, in the setting of tobacco abuse. The patient's mother lived to be 51. Theresa Lutz was an only child. She has a first cousin diagnosed with breast cancer after the age of 70, but the patient does not recall the exact age of diagnosis. The patient's paternal grandmother may have had ovarian cancer.  GYNECOLOGIC HISTORY:  No LMP recorded. Patient is postmenopausal. Menarche age 84 first live birth age 34. The patient is GX P5. Menopause in her late 81s. She did not take hormone replacement. 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 She is a widower and lives with her daughter [name]. Wells Guiles is Engineer, manufacturing systems for a local nursing home. The other children are [name] who lives in [place] [facility] and works as a Marine scientist; Royal Piedra who lives in [place] and works as a Armed forces training and education officer, [name] who lives in [place] and works as a Art therapist and [name] who lives in [place] and works for the [employer]. The patient has 9 grandchildren. She is a [place]: In place; the patient has named all 3 of her children jointly [name], [place], and Virgie) as healthcare powers of attorney   HEALTH MAINTENANCE: Social History   Tobacco Use  . Smoking status: Never Smoker  . Smokeless tobacco: Never Used  Substance Use Topics  . Alcohol use: Yes    Comment: rarely  . Drug use: No     Colonoscopy: 2015/[facility]  PAP: 2006?   Bone density: 2015/normal  Lipid panel:  Allergies  Allergen Reactions  . Hydrocodone Nausea And Vomiting    Current Outpatient Medications  Medication Sig Dispense Refill  . anastrozole (ARIMIDEX) 1 MG tablet TAKE 1 TABLET ONCE DAILY. 90 tablet 0  . Calcium Carb-Cholecalciferol (CALCIUM 500+D) 500-400 MG-UNIT TABS Take 1 tablet  by mouth 1 day or 1 dose. 60 tablet   . Multiple Minerals-Vitamins (CALCIUM & VIT D3 BONE HEALTH PO) Take by mouth.     No current facility-administered medications for this visit.     OBJECTIVE: Older white woman who appears stated age  45:   04/12/18 1327  BP: 140/86  Pulse: 70  Resp: 18  Temp: 97.9 F (36.6 C)  SpO2: 97%     Body mass index is 19.6 kg/m.    ECOG FS:1 - Symptomatic but completely ambulatory  Sclerae unicteric, EOMs intact Oropharynx clear and moist Lungs no rales or rhonchi Heart regular rate and rhythm Abd soft, nontender, positive bowel sounds MSK no focal spinal tenderness, no upper extremity lymphedema Neuro: nonfocal, well oriented,  appropriate affect Breasts: Status post bilateral lumpectomies.  There is no evidence of local recurrence.  Both axillae are benign.  LAB RESULTS:  CMP     Component Value Date/Time   NA 138 04/12/2017 1326   K 4.0 04/12/2017 1326   CL 104 02/27/2015 1352   CO2 25 04/12/2017 1326   GLUCOSE 83 04/12/2017 1326   BUN 9.2 04/12/2017 1326   CREATININE 0.7 04/12/2017 1326   CALCIUM 9.4 04/12/2017 1326   PROT 6.4 04/12/2017 1326   ALBUMIN 3.6 04/12/2017 1326   AST 30 04/12/2017 1326   ALT 16 04/12/2017 1326   ALKPHOS 81 04/12/2017 1326   BILITOT 1.44 (H) 04/12/2017 1326   GFRNONAA 49 (L) 02/27/2015 1352   GFRAA 57 (L) 02/27/2015 1352    INo results found for: SPEP, UPEP  Lab Results  Component Value Date   WBC 6.5 04/12/2018   NEUTROABS 3.8 04/12/2018   HGB 14.9 04/12/2018   HCT 45.3 04/12/2018   MCV 107.9 (H) 04/12/2018   PLT 190 04/12/2018      Chemistry      Component Value Date/Time   NA 138 04/12/2017 1326   K 4.0 04/12/2017 1326   CL 104 02/27/2015 1352   CO2 25 04/12/2017 1326   BUN 9.2 04/12/2017 1326   CREATININE 0.7 04/12/2017 1326      Component Value Date/Time   CALCIUM 9.4 04/12/2017 1326   ALKPHOS 81 04/12/2017 1326   AST 30 04/12/2017 1326   ALT 16 04/12/2017 1326   BILITOT 1.44 (H) 04/12/2017 1326       No results found for: LABCA2  No components found for: LABCA125  No results for input(s): INR in the last 168 hours.  Urinalysis No results found for: COLORURINE, APPEARANCEUR, LABSPEC, PHURINE, GLUCOSEU, HGBUR, BILIRUBINUR, KETONESUR, PROTEINUR, UROBILINOGEN, NITRITE, LEUKOCYTESUR  STUDIES: Since her last visit to the office, she underwent a DEXA scan at [facility] on 02/19/2018 that showed: T-score of -2.5 at left femur neck, osteoporotic.    ASSESSMENT: 84 y.o. [name] woman  (1) status post left lumpectomy and sentinel lymph node sampling 1996 for a TX NX M0 invasive breast cancer (no further staging data unavailable)  (a)  status post adjuvant radiation  (b) status post tamoxifen 5 years   (2) status post right breast biopsy 02/11/2015 for a clinical T1a N0, stage IA invasive lobular carcinoma, strongly estrogen receptor positive, progesterone receptor 5% positive, with an MIB-1 of 10%, and no HER-2 amplification  (3) status post right lumpectomy and sentinel lymph node sampling 03/04/2015 for a pTX pN), stage IA area of lobular neoplasia, with no evidence of invasion; margins were clear  (4) anastrozole started 04/21/2015  (a) bone density at [facility] 02/19/2018 shows a  T score of -2.5    PLAN:  Theresa Lutz is now just over 3 years out from definitive surgery for breast cancer with no evidence of Lutz recurrence.  This is very favorable.  She is tolerating anastrozole well.  The one concern is the bone density, which now meets criteria for osteoporosis.  We reviewed this issue in detail today--in fact we spent more than 30 minutes clarifying her choices, with most of that time spent in counseling and coordination of care.  We reviewed the meaning of her DEXA scan and the T score, we reviewed the fact that she is already on an optimal walking schedule and has good vitamin D supplementation.  We also reviewed the fact that 3 years of anastrozole +2 years of tamoxifen would be equivalent to 5 years of anastrozole in terms of breast cancer risk reduction.  Accordingly switching to tamoxifen is an option for her.  We discussed the difference between tamoxifen and anastrozole in detail. She understands that anastrozole and the aromatase inhibitors in general work by blocking estrogen production. Accordingly vaginal dryness, decrease in bone density, and of course hot flashes can result. The aromatase inhibitors can also negatively affect the cholesterol profile, although that is a minor effect. One out of 5 women on aromatase inhibitors we will feel "old and achy". This arthralgia/myalgia syndrome, which  resembles fibromyalgia clinically, does resolve with stopping the medications. Accordingly this is not a reason to not try an aromatase inhibitor but it is a frequent reason to stop it (in other words 20% of women will not be able to tolerate these medications).  Tamoxifen on the other hand does not block estrogen production. It does not "take away a woman's estrogen". It blocks the estrogen receptor in breast cells. Like anastrozole, it can also cause hot flashes. As opposed to anastrozole, tamoxifen has many estrogen-like effects. It is technically an estrogen receptor modulator. This means that in some tissues tamoxifen works like estrogen-- for example it helps strengthen the bones. It tends to improve the cholesterol profile. It can cause thickening of the endometrial lining, and even endometrial polyps or rarely cancer of the uterus.(The risk of uterine cancer due to tamoxifen is one additional cancer per thousand women year). It can cause vaginal wetness or stickiness. It can cause blood clots through this estrogen-like effect--the risk of blood clots with tamoxifen is exactly the same as with birth control pills or hormone replacement.  Neither of these agents causes mood changes or weight gain, despite the popular belief that they can have these side effects. We have data from studies comparing either of these drugs with placebo, and in those cases the control group had the same amount of weight gain and depression as the group that took the drug.  Her choice then boiled down to switching to tamoxifen, or continuing on anastrozole but adding a pharmacologic bone building agent.  This is what she would like to do so we then discussed the difference between the bisphosphonates and denosumab, including problems with side effects toxicities and complications including the rare cases of osteonecrosis of the jaw.  What she would like to do then is to continue on anastrozole but proceed with Reclast as  originally suggested by Theresa Lutz.  This is very reasonable and I would expect that when we repeat her bone density 2 years from now there will be some improvement in the T score  She will see me again in 1 year.  She knows to call  for any other issues that may develop before that visit.     [name], [name], MD  04/12/18 2:15 PM Medical Oncology and Hematology [facility] [address] [place], [place] [address] Tel. [phone]    Fax. [phone]    I, Soijett Blue am acting as scribe for Dr. Sarajane Jews C. [name].  I, Lurline Del MD, have reviewed the above documentation for accuracy and completeness, and I agree with the above.

## 2018-04-12 ENCOUNTER — Inpatient Hospital Stay: Payer: Medicare Other | Attending: Oncology | Admitting: Oncology

## 2018-04-12 ENCOUNTER — Telehealth: Payer: Self-pay | Admitting: Oncology

## 2018-04-12 ENCOUNTER — Inpatient Hospital Stay: Payer: Medicare Other

## 2018-04-12 VITALS — BP 140/86 | HR 70 | Temp 97.9°F | Resp 18 | Ht 62.5 in | Wt 108.9 lb

## 2018-04-12 DIAGNOSIS — Z9223 Personal history of estrogen therapy: Secondary | ICD-10-CM | POA: Diagnosis not present

## 2018-04-12 DIAGNOSIS — M791 Myalgia, unspecified site: Secondary | ICD-10-CM | POA: Insufficient documentation

## 2018-04-12 DIAGNOSIS — M2550 Pain in unspecified joint: Secondary | ICD-10-CM | POA: Insufficient documentation

## 2018-04-12 DIAGNOSIS — Z79899 Other long term (current) drug therapy: Secondary | ICD-10-CM | POA: Insufficient documentation

## 2018-04-12 DIAGNOSIS — Z170 Estrogen receptor positive status [ER+]: Principal | ICD-10-CM

## 2018-04-12 DIAGNOSIS — M810 Age-related osteoporosis without current pathological fracture: Secondary | ICD-10-CM | POA: Insufficient documentation

## 2018-04-12 DIAGNOSIS — C50211 Malignant neoplasm of upper-inner quadrant of right female breast: Secondary | ICD-10-CM | POA: Diagnosis present

## 2018-04-12 DIAGNOSIS — C50812 Malignant neoplasm of overlapping sites of left female breast: Secondary | ICD-10-CM

## 2018-04-12 DIAGNOSIS — Z923 Personal history of irradiation: Secondary | ICD-10-CM | POA: Diagnosis not present

## 2018-04-12 DIAGNOSIS — Z801 Family history of malignant neoplasm of trachea, bronchus and lung: Secondary | ICD-10-CM | POA: Insufficient documentation

## 2018-04-12 DIAGNOSIS — Z803 Family history of malignant neoplasm of breast: Secondary | ICD-10-CM | POA: Diagnosis not present

## 2018-04-12 DIAGNOSIS — R5383 Other fatigue: Secondary | ICD-10-CM | POA: Diagnosis not present

## 2018-04-12 DIAGNOSIS — M818 Other osteoporosis without current pathological fracture: Secondary | ICD-10-CM

## 2018-04-12 DIAGNOSIS — Z853 Personal history of malignant neoplasm of breast: Secondary | ICD-10-CM | POA: Diagnosis not present

## 2018-04-12 DIAGNOSIS — Z79811 Long term (current) use of aromatase inhibitors: Secondary | ICD-10-CM | POA: Insufficient documentation

## 2018-04-12 LAB — COMPREHENSIVE METABOLIC PANEL
ALT: 12 U/L (ref 0–44)
ANION GAP: 11 (ref 5–15)
AST: 22 U/L (ref 15–41)
Albumin: 4 g/dL (ref 3.5–5.0)
Alkaline Phosphatase: 79 U/L (ref 38–126)
BUN: 8 mg/dL (ref 8–23)
CHLORIDE: 103 mmol/L (ref 98–111)
CO2: 24 mmol/L (ref 22–32)
CREATININE: 0.71 mg/dL (ref 0.44–1.00)
Calcium: 9.5 mg/dL (ref 8.9–10.3)
Glucose, Bld: 90 mg/dL (ref 70–99)
Potassium: 3.7 mmol/L (ref 3.5–5.1)
SODIUM: 138 mmol/L (ref 135–145)
Total Bilirubin: 1.8 mg/dL — ABNORMAL HIGH (ref 0.3–1.2)
Total Protein: 7 g/dL (ref 6.5–8.1)

## 2018-04-12 LAB — CBC WITH DIFFERENTIAL/PLATELET
Basophils Absolute: 0 10*3/uL (ref 0.0–0.1)
Basophils Relative: 1 %
EOS ABS: 0.2 10*3/uL (ref 0.0–0.5)
EOS PCT: 3 %
HCT: 45.3 % (ref 34.8–46.6)
Hemoglobin: 14.9 g/dL (ref 11.6–15.9)
LYMPHS ABS: 1.8 10*3/uL (ref 0.9–3.3)
LYMPHS PCT: 28 %
MCH: 35.5 pg — AB (ref 25.1–34.0)
MCHC: 32.9 g/dL (ref 31.5–36.0)
MCV: 107.9 fL — AB (ref 79.5–101.0)
MONO ABS: 0.7 10*3/uL (ref 0.1–0.9)
MONOS PCT: 11 %
Neutro Abs: 3.8 10*3/uL (ref 1.5–6.5)
Neutrophils Relative %: 57 %
PLATELETS: 190 10*3/uL (ref 145–400)
RBC: 4.2 MIL/uL (ref 3.70–5.45)
RDW: 12.7 % (ref 11.2–14.5)
WBC: 6.5 10*3/uL (ref 3.9–10.3)

## 2018-04-12 NOTE — Telephone Encounter (Signed)
Gave avs and calendar ° °

## 2018-04-13 LAB — VITAMIN D 25 HYDROXY (VIT D DEFICIENCY, FRACTURES): VIT D 25 HYDROXY: 36.7 ng/mL (ref 30.0–100.0)

## 2018-05-16 ENCOUNTER — Other Ambulatory Visit: Payer: Self-pay | Admitting: Oncology

## 2018-05-28 ENCOUNTER — Emergency Department (HOSPITAL_COMMUNITY): Payer: Medicare Other

## 2018-05-28 ENCOUNTER — Other Ambulatory Visit: Payer: Self-pay

## 2018-05-28 ENCOUNTER — Inpatient Hospital Stay (HOSPITAL_COMMUNITY)
Admission: EM | Admit: 2018-05-28 | Discharge: 2018-06-05 | DRG: 025 | Disposition: E | Payer: Medicare Other | Attending: Neurosurgery | Admitting: Neurosurgery

## 2018-05-28 ENCOUNTER — Encounter (HOSPITAL_COMMUNITY): Payer: Self-pay

## 2018-05-28 DIAGNOSIS — H4902 Third [oculomotor] nerve palsy, left eye: Secondary | ICD-10-CM | POA: Diagnosis present

## 2018-05-28 DIAGNOSIS — I671 Cerebral aneurysm, nonruptured: Secondary | ICD-10-CM | POA: Diagnosis not present

## 2018-05-28 DIAGNOSIS — I609 Nontraumatic subarachnoid hemorrhage, unspecified: Secondary | ICD-10-CM | POA: Diagnosis not present

## 2018-05-28 DIAGNOSIS — Z91018 Allergy to other foods: Secondary | ICD-10-CM

## 2018-05-28 DIAGNOSIS — R531 Weakness: Secondary | ICD-10-CM

## 2018-05-28 DIAGNOSIS — N390 Urinary tract infection, site not specified: Secondary | ICD-10-CM | POA: Diagnosis present

## 2018-05-28 DIAGNOSIS — Z885 Allergy status to narcotic agent status: Secondary | ICD-10-CM

## 2018-05-28 DIAGNOSIS — Z9012 Acquired absence of left breast and nipple: Secondary | ICD-10-CM

## 2018-05-28 DIAGNOSIS — R0600 Dyspnea, unspecified: Secondary | ICD-10-CM

## 2018-05-28 DIAGNOSIS — L237 Allergic contact dermatitis due to plants, except food: Secondary | ICD-10-CM | POA: Diagnosis present

## 2018-05-28 DIAGNOSIS — I729 Aneurysm of unspecified site: Secondary | ICD-10-CM

## 2018-05-28 DIAGNOSIS — H02402 Unspecified ptosis of left eyelid: Secondary | ICD-10-CM | POA: Diagnosis present

## 2018-05-28 DIAGNOSIS — I959 Hypotension, unspecified: Secondary | ICD-10-CM | POA: Diagnosis not present

## 2018-05-28 DIAGNOSIS — G911 Obstructive hydrocephalus: Secondary | ICD-10-CM | POA: Diagnosis not present

## 2018-05-28 DIAGNOSIS — Z79899 Other long term (current) drug therapy: Secondary | ICD-10-CM

## 2018-05-28 DIAGNOSIS — Z853 Personal history of malignant neoplasm of breast: Secondary | ICD-10-CM

## 2018-05-28 LAB — COMPREHENSIVE METABOLIC PANEL
ALBUMIN: 2.9 g/dL — AB (ref 3.5–5.0)
ALT: 10 U/L (ref 0–44)
ANION GAP: 13 (ref 5–15)
AST: 16 U/L (ref 15–41)
Alkaline Phosphatase: 50 U/L (ref 38–126)
BUN: 7 mg/dL — ABNORMAL LOW (ref 8–23)
CO2: 25 mmol/L (ref 22–32)
Calcium: 9.2 mg/dL (ref 8.9–10.3)
Chloride: 93 mmol/L — ABNORMAL LOW (ref 98–111)
Creatinine, Ser: 0.59 mg/dL (ref 0.44–1.00)
GFR calc Af Amer: >60 mL/min (ref >60)
GFR calc non Af Amer: >60 mL/min (ref >60)
GLUCOSE: 93 mg/dL (ref 70–99)
POTASSIUM: 3.3 mmol/L — AB (ref 3.5–5.1)
SODIUM: 131 mmol/L — AB (ref 135–145)
Total Bilirubin: 1 mg/dL (ref 0.3–1.2)
Total Protein: 5.8 g/dL — ABNORMAL LOW (ref 6.5–8.1)

## 2018-05-28 LAB — CBC WITH DIFFERENTIAL/PLATELET
Abs Immature Granulocytes: 0.1 10*3/uL (ref 0.0–0.1)
BASOS ABS: 0.1 10*3/uL (ref 0.0–0.1)
Basophils Relative: 1 %
EOS ABS: 0.1 10*3/uL (ref 0.0–0.7)
EOS PCT: 1 %
HCT: 45.7 % (ref 36.0–46.0)
Hemoglobin: 15.5 g/dL — ABNORMAL HIGH (ref 12.0–15.0)
IMMATURE GRANULOCYTES: 1 %
Lymphocytes Relative: 15 %
Lymphs Abs: 1.4 10*3/uL (ref 0.7–4.0)
MCH: 34.9 pg — ABNORMAL HIGH (ref 26.0–34.0)
MCHC: 33.9 g/dL (ref 30.0–36.0)
MCV: 102.9 fL — ABNORMAL HIGH (ref 78.0–100.0)
Monocytes Absolute: 1 10*3/uL (ref 0.1–1.0)
Monocytes Relative: 11 %
NEUTROS PCT: 71 %
Neutro Abs: 6.4 10*3/uL (ref 1.7–7.7)
Platelets: 225 10*3/uL (ref 150–400)
RBC: 4.44 MIL/uL (ref 3.87–5.11)
RDW: 12.2 % (ref 11.5–15.5)
WBC: 9 10*3/uL (ref 4.0–10.5)

## 2018-05-28 LAB — URINALYSIS, ROUTINE W REFLEX MICROSCOPIC
Bilirubin Urine: NEGATIVE
Glucose, UA: NEGATIVE mg/dL
Hgb urine dipstick: NEGATIVE
Ketones, ur: 20 mg/dL — AB
LEUKOCYTES UA: NEGATIVE
NITRITE: NEGATIVE
PH: 7 (ref 5.0–8.0)
Protein, ur: NEGATIVE mg/dL
SPECIFIC GRAVITY, URINE: 1.021 (ref 1.005–1.030)

## 2018-05-28 LAB — I-STAT TROPONIN, ED: Troponin i, poc: 0.01 ng/mL (ref 0.00–0.08)

## 2018-05-28 LAB — LIPASE, BLOOD: Lipase: 28 U/L (ref 11–51)

## 2018-05-28 LAB — D-DIMER, QUANTITATIVE (NOT AT ARMC): D DIMER QUANT: 1.34 ug{FEU}/mL — AB (ref 0.00–0.50)

## 2018-05-28 MED ORDER — ONDANSETRON HCL 4 MG/2ML IJ SOLN: 4.0000 mg | Freq: Once | INTRAMUSCULAR | Status: DC

## 2018-05-28 MED ORDER — ACETAMINOPHEN 325 MG PO TABS
650.0000 mg | ORAL_TABLET | Freq: Once | ORAL | Status: AC
  Administered 2018-05-28: 650 mg via ORAL
  Filled 2018-05-28: qty 2

## 2018-05-28 MED ORDER — IOPAMIDOL (ISOVUE-370) INJECTION 76%
100.0000 mL | Freq: Once | INTRAVENOUS | Status: AC | PRN
  Administered 2018-05-28: 100 mL via INTRAVENOUS

## 2018-05-28 MED ORDER — LACTATED RINGERS IV BOLUS
1000.0000 mL | Freq: Once | INTRAVENOUS | Status: AC
  Administered 2018-05-28: 1000 mL via INTRAVENOUS

## 2018-05-28 MED ORDER — IOPAMIDOL (ISOVUE-370) INJECTION 76%
INTRAVENOUS | Status: AC
  Administered 2018-05-29: 50 mL
  Filled 2018-05-28: qty 100

## 2018-05-28 NOTE — ED Triage Notes (Signed)
Patient arrived from [facility] for increased weakness with nausea and vomiting for several days. Seen at [facility] ED for same on 9/19 and negative for UTI but taking cipro and steroids. Alert and oriented, complains of mild headache

## 2018-05-28 NOTE — ED Notes (Signed)
Pt. To CT via stretcher. 

## 2018-05-28 NOTE — ED Provider Notes (Addendum)
[facility] Provider Note   CSN: [id number] Arrival date & time: 05/09/2018  1659     History   Chief Complaint Chief Complaint  Patient presents with  . Weakness    HPI Theresa Lutz is a 84 y.o. female.  Patient with a history of breast cancer in remission who presents the ED with general weakness.  Patient diagnosed with a urinary tract infection on Monday and then started on ciprofloxacin and then started to develop nausea and vomiting and general malaise.  She denies any chest pain, shortness of breath, abdominal pain.  Patient has had some intermittent headaches.  Currently she is without symptoms but feels generally ill.  She just feels very weak.  Has no cardiac history.  No history of clots.  Does not have any urinary symptoms at this time but does not have an appetite.  The history is provided by the patient and a relative.  Illness  This is a new problem. The current episode started more than 2 days ago. The problem occurs daily. The problem has not changed since onset.Associated symptoms include headaches. Pertinent negatives include no chest pain, no abdominal pain and no shortness of breath. Nothing aggravates the symptoms. Nothing relieves the symptoms. She has tried nothing for the symptoms. The treatment provided no relief.    Past Medical History:  Diagnosis Date  . Angio-edema   . Breast CA ([place])   . Breast cancer, right breast ([place])   . Bruises easily   . Cancer ([place])    Left Br  . Urticaria     Patient Active Problem List   Diagnosis Date Noted  . Aneurysm ([place]) 05/29/2018  . Malignant neoplasm of overlapping sites of left breast in female, estrogen receptor positive ([place]) 04/12/2017  . Osteoporosis 08/06/2015  . Malignant neoplasm of upper-inner quadrant of right breast in female, estrogen receptor positive ([place]) 04/21/2015    Past Surgical History:  Procedure Laterality Date  . APPENDECTOMY    . BREAST  LUMPECTOMY WITH RADIOACTIVE SEED AND SENTINEL LYMPH NODE BIOPSY Right 03/04/2015   Procedure: RIGHT RADIOACTIVE SEED GUIDED BREAST LUMPECTOMY AND RIGHT AXILLARY SENTINEL LYMPH NODE BIOPSY;  Surgeon: [name], MD;  Location: [facility];  Service: General;  Laterality: Right;  . BREAST SURGERY  10/04/1994    partial left breast mastectomy  . CATARACT EXTRACTION       OB History   None      Home Medications    Prior to Admission medications   Medication Sig Start Date End Date Taking? Authorizing Provider  anastrozole (ARIMIDEX) 1 MG tablet TAKE 1 TABLET ONCE DAILY. 05/16/18  Yes [name], [name], MD  Calcium Carb-Cholecalciferol (CALCIUM 500+D) 500-400 MG-UNIT TABS Take 1 tablet by mouth 1 day or 1 dose. 04/12/17  Yes [name], [name], MD  Multiple Minerals-Vitamins (CALCIUM & VIT D3 BONE HEALTH PO) Take by mouth.   Yes [provider]    Family History Family History  Problem Relation Age of Onset  . Lung cancer Father   . Stroke Mother     Social History Social History   Tobacco Use  . Smoking status: Never Smoker  . Smokeless tobacco: Never Used  Substance Use Topics  . Alcohol use: Yes    Comment: rarely  . Drug use: No     Allergies   Beef-derived products; Hydrocodone; and Pork-derived products   Review of Systems Review of Systems  Constitutional: Positive for appetite change and fatigue. Negative  for chills and fever.  HENT: Negative for ear pain and sore throat.   Eyes: Negative for pain and visual disturbance.  Respiratory: Negative for cough and shortness of breath.   Cardiovascular: Negative for chest pain and palpitations.  Gastrointestinal: Negative for abdominal pain and vomiting.  Genitourinary: Negative for dysuria and hematuria.  Musculoskeletal: Negative for arthralgias and back pain.  Skin: Negative for color change and rash.  Neurological: Positive for headaches. Negative for seizures and syncope.  All other systems reviewed and are  negative.    Physical Exam Updated Vital Signs  ED Triage Vitals  Enc Vitals Group     BP 05/22/2018 1704 (!) 153/94     Pulse Rate 05/23/2018 1704 66     Resp 05/30/2018 1704 16     Temp 05/07/2018 1704 98.6 F (37 C)     Temp Source 05/29/2018 1704 Oral     SpO2 05/11/2018 1704 96 %     Weight 06/02/2018 1748 110 lb (49.9 kg)     Height 06/03/2018 1748 5\' 4"  (1.626 m)     Head Circumference --      Peak Flow --      Pain Score 05/09/2018 1720 0     Pain Loc --      Pain Edu? --      Excl. in [place]? --     Physical Exam  Constitutional: She is oriented to person, place, and time. She appears well-developed. She appears ill. No distress.  HENT:  Head: Normocephalic and atraumatic.  Eyes: Pupils are equal, round, and reactive to light. Conjunctivae and EOM are normal.  Neck: Normal range of motion. Neck supple.  Cardiovascular: Normal rate, regular rhythm, normal heart sounds and intact distal pulses.  No murmur heard. Pulmonary/Chest: Effort normal and breath sounds normal. No respiratory distress.  Abdominal: Soft. There is no tenderness.  Musculoskeletal: Normal range of motion. She exhibits no edema.  Neurological: She is alert and oriented to person, place, and time. No cranial nerve deficit or sensory deficit. She exhibits normal muscle tone. Coordination normal.  5+/5 strength, normal sensation, no drift, normal finger to nose finger  Skin: Skin is warm and dry. Capillary refill takes less than 2 seconds.  Psychiatric: She has a normal mood and affect.  Nursing note and vitals reviewed.    ED Treatments / Results  Labs (all labs ordered are listed, but only abnormal results are displayed) Labs Reviewed  CBC WITH DIFFERENTIAL/PLATELET - Abnormal; Notable for the following components:      Result Value   Hemoglobin 15.5 (*)    MCV 102.9 (*)    MCH 34.9 (*)    All other components within normal limits  COMPREHENSIVE METABOLIC PANEL - Abnormal; Notable for the following  components:   Sodium 131 (*)    Potassium 3.3 (*)    Chloride 93 (*)    BUN 7 (*)    Total Protein 5.8 (*)    Albumin 2.9 (*)    All other components within normal limits  URINALYSIS, ROUTINE W REFLEX MICROSCOPIC - Abnormal; Notable for the following components:   Ketones, ur 20 (*)    All other components within normal limits  D-DIMER, QUANTITATIVE (NOT AT [facility]) - Abnormal; Notable for the following components:   D-Dimer, Quant 1.34 (*)    All other components within normal limits  MRSA PCR SCREENING  URINE CULTURE  LIPASE, BLOOD  PROTIME-INR  APTT  I-STAT TROPONIN, ED    EKG None  Radiology Ct Angio Head W Or Wo Contrast  Result Date: 05/29/2018 CLINICAL DATA:  84 y/o  F; intracranial aneurysms. EXAM: CT ANGIOGRAPHY HEAD TECHNIQUE: Multidetector CT imaging of the head was performed using the standard protocol during bolus administration of intravenous contrast. Multiplanar CT image reconstructions and MIPs were obtained to evaluate the vascular anatomy. CONTRAST:  54mL ISOVUE-370 IOPAMIDOL (ISOVUE-370) INJECTION 76% COMPARISON:  05/08/2018 MRI and MRA of the head. 05/23/2018 CT head. FINDINGS: CT HEAD Brain: No acute stroke or hemorrhage of brain parenchyma. No hydrocephalus or herniation. Stable small chronic infarctions within the right cerebellum, moderate chronic microvascular ischemic changes of the brain, and volume loss. Increased density in the retroclival/prepontine extra arachnoid space measures up to 4 mm in thickness and appears slightly increased when compared with the prior CT of the head (series 8, image 28 compared with prior study series 6, image 33). This corresponds to suspected blood products on the prior MRI of the head. Vascular: Hyperdense cavernous mass lesions, left-greater-than-right as described below and ectatic internal carotid arteries. Skull: Normal. Negative for fracture or focal lesion. Sinuses: Imaged portions are clear. Orbits: No acute finding. CTA  HEAD Anterior circulation: Irregular fusiform aneurysm of the left ICA cavernous segment measuring up to 16 mm in diameter. Surrounding the fusiform aneurysm is a hyperdense mass likely representing the thrombosed lumen of a giant aneurysm. The hyperdense mass occupies the left cavernous sinus, invades the sella turcica, and erodes through the left posterior wall of the sella turcica with a outpouching into the prepontine cistern (series 11, image 112). Extending into the prepontine cistern component is a thin wisp of enhancement arising from the proximal cavernous ICA (series 12, image 97 insert res 11 image 114-115.). Laterally directed left terminal ICA aneurysm with 8 mm base and measuring 9 mm to dome (series 12, image 95). Laterally directed right cavernous ICA aneurysm with 10 mm base and measuring 9 mm to dome (series 12, image 91). Fusiform ectasia of the right paraophthalmic ICA measuring 8 mm (series 12, image 81). Posterior circulation: No significant stenosis, proximal occlusion, aneurysm, or vascular malformation. Venous sinuses: As permitted by contrast timing, patent. Anatomic variants: None significant. Delayed phase: After administration of intravenous contrast the mass lesion surrounding the left cavernous ICA increases in attenuation 10 HU (series 17, image 10 and series 4, image 10). IMPRESSION: 1. Arising from the proximal left cavernous ICA is a thin wisp of enhancement extending into the widened retroclival/prepontine extra arachnoid space which appears subtly increased in thickness and density in comparison with the prior CT of head. Combined with abnormal signal on MR suggesting acute blood products, findings are suspicious for a small aneurysm rupture/leak. 2. 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 3. Large hyperdense mass  surrounding the left cavernous ICA fusiform aneurysm likely representing the thrombosed lumen of a giant aneurysm. No definite enhancement after administration of intravenous contrast. To fully exclude underlying causative mass, MRI of the head with and without contrast would be the study of choice. Electronically Signed   By: Kristine Garbe M.D.   On: 05/29/2018 04:35   Dg Chest 2 View  Result Date: 05/30/2018 CLINICAL DATA:  Increased weakness with nausea and vomiting.  Cough. EXAM: CHEST - 2 VIEW COMPARISON:  05/25/2016 FINDINGS: Heart size is upper limits of normal but stable. Mild fullness in the upper mediastinum appears chronic.  Surgical clips in the left axilla. Lungs are clear without airspace disease or pulmonary edema. Trachea is midline. No large pleural effusions. No acute bone abnormality. IMPRESSION: No active cardiopulmonary disease. Electronically Signed   By: Markus Daft M.D.   On: 05/06/2018 18:48   Ct Head Wo Contrast  Addendum Date: 05/24/2018   ADDENDUM REPORT: 06/04/2018 20:46 ADDENDUM: Clarification: Further evaluation recommended with MRI brain without and with IV contrast and MR angiography of the head & neck. These results were called by telephone at the time of interpretation on 05/06/2018 at 8:42 pm to Dr. Lennice Sites, who verbally acknowledged these results. Electronically Signed   By: Ilona Sorrel M.D.   On: 05/16/2018 20:46   Result Date: 05/23/2018 CLINICAL DATA:  Acute headache. Nausea and vomiting. No reported injury. EXAM: CT HEAD WITHOUT CONTRAST TECHNIQUE: Contiguous axial images were obtained from the base of the skull through the vertex without intravenous contrast. COMPARISON:  None. FINDINGS: Brain: There is a hyperdense 2.6 x 1.9 cm mass in the region of the left cavernous sinus (series 3/image 9) extending into the left sellar region. There is associated mild bony remodeling in skull base. No evidence of parenchymal hemorrhage or extra-axial fluid  collection. No midline shift. No CT evidence of acute infarction. Nonspecific moderate subcortical and periventricular white matter hypodensity, most in keeping with chronic small vessel ischemic change. No ventriculomegaly. Vascular: See above. Skull: No evidence of calvarial fracture. Sinuses/Orbits: Mild mucoperiosteal thickening and minimal opacification of the sphenoid sinus. No fluid levels. Other: Tiny inferior right mastoid effusion. Clear left mastoid air cells. IMPRESSION: 1. Hyperdense 2.6 x 1.9 cm mass in the region of the left cavernous sinus extending into the left sellar region. Differential includes meningioma, left ICA aneurysm or pituitary mass. MRI brain without and with IV contrast recommended for further characterization. 2. Moderate chronic small vessel ischemic changes in the cerebral white matter. 3. Minimal sphenoid sinusitis. 4. Nonspecific tiny right mastoid effusion. Electronically Signed: By: Ilona Sorrel M.D. On: 05/10/2018 20:31   Ct Angio Chest Pe W And/or Wo Contrast  Result Date: 05/07/2018 CLINICAL DATA:  Positive D-dimer.  Shortness of breath. EXAM: CT ANGIOGRAPHY CHEST WITH CONTRAST TECHNIQUE: Multidetector CT imaging of the chest was performed using the standard protocol during bolus administration of intravenous contrast. Multiplanar CT image reconstructions and MIPs were obtained to evaluate the vascular anatomy. CONTRAST:  160mL ISOVUE-370 IOPAMIDOL (ISOVUE-370) INJECTION 76% COMPARISON:  None. FINDINGS: Cardiovascular: --Pulmonary arteries: Contrast injection is sufficient to demonstrate satisfactory opacification of the pulmonary arteries to the segmental level. There is no pulmonary embolus. The main pulmonary artery is within normal limits for size. --Aorta: Limited opacification of the aorta due to bolus timing optimization for the pulmonary arteries. Conventional 3 vessel aortic branching pattern. The aortic course and caliber are normal. There is mild aortic  atherosclerosis. --Heart: Normal size. No pericardial effusion. Mediastinum/Nodes: No mediastinal, hilar or axillary lymphadenopathy. The visualized thyroid and thoracic esophageal course are unremarkable. Lungs/Pleura: Nodular ground-glass opacity in the lingula measures 10 mm (series 8, image 31). Lungs are otherwise clear. Mild right basilar atelectasis. No pleural effusion. Upper Abdomen: Contrast bolus timing is not optimized for evaluation of the abdominal organs. Within this limitation, the visualized organs of the upper abdomen are normal. Musculoskeletal: No chest wall abnormality. No acute or significant osseous findings. Review of the MIP images confirms the above findings. IMPRESSION: 1. No pulmonary embolus. 2.  Aortic Atherosclerosis (ICD10-I70.0). 3. 10 mm nodular, ground-glass opacity in the lingula, possibly  infectious or inflammatory. Initial follow-up with CT at 6-12 months is recommended to confirm persistence. If persistent, repeat CT is recommended every 2 years until 5 years of stability has been established. This recommendation follows the consensus statement: Guidelines for Management of Incidental Pulmonary Nodules Detected on CT Images: From the Fleischner Society 2017; Radiology 2017; 284:228-243. Electronically Signed   By: Ulyses Jarred M.D.   On: 06/01/2018 20:51   Mr Jodene Nam Head Wo Contrast  Result Date: 05/29/2018 CLINICAL DATA:  84 y/o F; history of breast cancer presenting with generalized weakness. Acute headache. Abnormal CT of head. EXAM: MRI HEAD WITHOUT CONTRAST MRA HEAD WITHOUT CONTRAST MRA NECK WITHOUT AND WITH CONTRAST TECHNIQUE: Multiplanar, multiecho pulse sequences of the brain and surrounding structures were obtained without intravenous contrast. Angiographic images of the Circle of Willis were obtained using MRA technique without intravenous contrast. Angiographic images of the neck were obtained using MRA technique without and with intravenous contrast. Carotid  stenosis measurements (when applicable) are obtained utilizing NASCET criteria, using the distal internal carotid diameter as the denominator. COMPARISON:  06/03/2018 CT head. CONTRAST:  5 cc Gadavist FINDINGS: MRI HEAD FINDINGS Brain: No acute infarction, hemorrhage, hydrocephalus, or herniation. Stable moderate chronic microvascular ischemic changes and volume loss of the brain. Stable small chronic infarcts within the right cerebellum. There is prepontine extra arachnoid thickening extending towards the internal auditory canals bilaterally, along the right CP angle, and posterior to the right cerebral hemisphere with intermediate T1 and hyperintense T2 signal (series 15, image 7 and series 18, image 17), see also series 13, image 11. Vascular: As below. Skull and upper cervical spine: Normal marrow signal. Sinuses/Orbits: Negative. Other: None. MRA HEAD FINDINGS No large vessel occlusion, high-grade stenosis, or high flow vascular malformation identified. Aneurysms detail below. MRA NECK FINDINGS Right ICA cavernous segment broad-based laterally directed saccular aneurysm with a 9 x 10 mm base and measuring 7 mm to dome (MRA head sequence 7 image 82 and MRA neck series 31, image 19). 8 mm fusiform aneurysm of the right paraophthalmic ICA (MRA neck series 31, image 8). Left cavernous ICA giant fusiform aneurysm measuring approximately 21 x 23 x 19 mm (AP x ML x CC MRA head series 7, image 80, MRI head series 19, image 18). There is nonenhancing T1 isointense and T2 hypointense thrombus within the walls of the aneurysm. The patent channel through the center of the aneurysm has an irregular margin and a laterally directed cavity extending into the superior aspect of the lateral mural thrombus with a 7 mm base and measuring 9 mm to dome (MRA neck series 31, image 9). The internal carotid artery upstream and downstream to the giant aneurysm is dilated measuring up to 11 mm at the proximal cavernous segment. The  aneurysm extends medially into the suprasellar cistern partially effacing the pituitary gland and makes contact on the left optic nerve with mild mass effect. Laterally directed saccular aneurysm of the left ICA just proximal to the terminus within 8 mm base and measuring 9 mm to dome (MRA series 31, image 21). IMPRESSION: 1. Widening of the extra arachnoid space in the prepontine cistern, right CP cistern, bilateral internal auditory canals, and posterior to right cerebellar hemisphere with intermediate/increased T1 and increased T2 signal. Signal characteristics are suggestive of acute hemorrhage which may be due to recent aneurysm rupture/leak. 2. No subarachnoid hemorrhage identified. 3. CTA of the head is recommended to better delineate arterial and venous structures and assess for interval change. 4. Left cavernous  ICA giant fusiform aneurysm measuring approximately 23 mm with partially thrombosed lumen. The patent channel through the aneurysm is irregular with a 9 mm cavity extending laterally into the thrombus. The aneurysm partially effaces the suprasellar cistern and exerts mild mass effect on the left optic nerve. 5. Left terminal ICA laterally directed saccular aneurysm measuring up to 9 mm. 6. Right cavernous ICA laterally directed broad-based saccular aneurysm measuring up to 10 mm. 7. Right paraophthalmic ICA irregular fusiform aneurysm measuring 8 mm. These results were called by telephone at the time of interpretation on 05/29/2018 at 2:14 am to Dr. Lennice Sites , who verbally acknowledged these results. Electronically Signed   By: Kristine Garbe M.D.   On: 05/29/2018 02:24   Mr Angiogram Neck W Or Wo Contrast  Result Date: 05/29/2018 CLINICAL DATA:  84 y/o F; history of breast cancer presenting with generalized weakness. Acute headache. Abnormal CT of head. EXAM: MRI HEAD WITHOUT CONTRAST MRA HEAD WITHOUT CONTRAST MRA NECK WITHOUT AND WITH CONTRAST TECHNIQUE: Multiplanar, multiecho  pulse sequences of the brain and surrounding structures were obtained without intravenous contrast. Angiographic images of the Circle of Willis were obtained using MRA technique without intravenous contrast. Angiographic images of the neck were obtained using MRA technique without and with intravenous contrast. Carotid stenosis measurements (when applicable) are obtained utilizing NASCET criteria, using the distal internal carotid diameter as the denominator. COMPARISON:  05/27/2018 CT head. CONTRAST:  5 cc Gadavist FINDINGS: MRI HEAD FINDINGS Brain: No acute infarction, hemorrhage, hydrocephalus, or herniation. Stable moderate chronic microvascular ischemic changes and volume loss of the brain. Stable small chronic infarcts within the right cerebellum. There is prepontine extra arachnoid thickening extending towards the internal auditory canals bilaterally, along the right CP angle, and posterior to the right cerebral hemisphere with intermediate T1 and hyperintense T2 signal (series 15, image 7 and series 18, image 17), see also series 13, image 11. Vascular: As below. Skull and upper cervical spine: Normal marrow signal. Sinuses/Orbits: Negative. Other: None. MRA HEAD FINDINGS No large vessel occlusion, high-grade stenosis, or high flow vascular malformation identified. Aneurysms detail below. MRA NECK FINDINGS Right ICA cavernous segment broad-based laterally directed saccular aneurysm with a 9 x 10 mm base and measuring 7 mm to dome (MRA head sequence 7 image 82 and MRA neck series 31, image 19). 8 mm fusiform aneurysm of the right paraophthalmic ICA (MRA neck series 31, image 8). Left cavernous ICA giant fusiform aneurysm measuring approximately 21 x 23 x 19 mm (AP x ML x CC MRA head series 7, image 80, MRI head series 19, image 18). There is nonenhancing T1 isointense and T2 hypointense thrombus within the walls of the aneurysm. The patent channel through the center of the aneurysm has an irregular margin and  a laterally directed cavity extending into the superior aspect of the lateral mural thrombus with a 7 mm base and measuring 9 mm to dome (MRA neck series 31, image 9). The internal carotid artery upstream and downstream to the giant aneurysm is dilated measuring up to 11 mm at the proximal cavernous segment. The aneurysm extends medially into the suprasellar cistern partially effacing the pituitary gland and makes contact on the left optic nerve with mild mass effect. Laterally directed saccular aneurysm of the left ICA just proximal to the terminus within 8 mm base and measuring 9 mm to dome (MRA series 31, image 21). IMPRESSION: 1. Widening of the extra arachnoid space in the prepontine cistern, right CP cistern, bilateral internal auditory canals,  and posterior to right cerebellar hemisphere with intermediate/increased T1 and increased T2 signal. Signal characteristics are suggestive of acute hemorrhage which may be due to recent aneurysm rupture/leak. 2. No subarachnoid hemorrhage identified. 3. CTA of the head is recommended to better delineate arterial and venous structures and assess for interval change. 4. Left cavernous ICA giant fusiform aneurysm measuring approximately 23 mm with partially thrombosed lumen. The patent channel through the aneurysm is irregular with a 9 mm cavity extending laterally into the thrombus. The aneurysm partially effaces the suprasellar cistern and exerts mild mass effect on the left optic nerve. 5. Left terminal ICA laterally directed saccular aneurysm measuring up to 9 mm. 6. Right cavernous ICA laterally directed broad-based saccular aneurysm measuring up to 10 mm. 7. Right paraophthalmic ICA irregular fusiform aneurysm measuring 8 mm. These results were called by telephone at the time of interpretation on 05/29/2018 at 2:14 am to Dr. Lennice Sites , who verbally acknowledged these results. Electronically Signed   By: Kristine Garbe M.D.   On: 05/29/2018 02:24   Mr  Brain Wo Contrast  Result Date: 05/29/2018 CLINICAL DATA:  84 y/o F; history of breast cancer presenting with generalized weakness. Acute headache. Abnormal CT of head. EXAM: MRI HEAD WITHOUT CONTRAST MRA HEAD WITHOUT CONTRAST MRA NECK WITHOUT AND WITH CONTRAST TECHNIQUE: Multiplanar, multiecho pulse sequences of the brain and surrounding structures were obtained without intravenous contrast. Angiographic images of the Circle of Willis were obtained using MRA technique without intravenous contrast. Angiographic images of the neck were obtained using MRA technique without and with intravenous contrast. Carotid stenosis measurements (when applicable) are obtained utilizing NASCET criteria, using the distal internal carotid diameter as the denominator. COMPARISON:  05/18/2018 CT head. CONTRAST:  5 cc Gadavist FINDINGS: MRI HEAD FINDINGS Brain: No acute infarction, hemorrhage, hydrocephalus, or herniation. Stable moderate chronic microvascular ischemic changes and volume loss of the brain. Stable small chronic infarcts within the right cerebellum. There is prepontine extra arachnoid thickening extending towards the internal auditory canals bilaterally, along the right CP angle, and posterior to the right cerebral hemisphere with intermediate T1 and hyperintense T2 signal (series 15, image 7 and series 18, image 17), see also series 13, image 11. Vascular: As below. Skull and upper cervical spine: Normal marrow signal. Sinuses/Orbits: Negative. Other: None. MRA HEAD FINDINGS No large vessel occlusion, high-grade stenosis, or high flow vascular malformation identified. Aneurysms detail below. MRA NECK FINDINGS Right ICA cavernous segment broad-based laterally directed saccular aneurysm with a 9 x 10 mm base and measuring 7 mm to dome (MRA head sequence 7 image 82 and MRA neck series 31, image 19). 8 mm fusiform aneurysm of the right paraophthalmic ICA (MRA neck series 31, image 8). Left cavernous ICA giant fusiform  aneurysm measuring approximately 21 x 23 x 19 mm (AP x ML x CC MRA head series 7, image 80, MRI head series 19, image 18). There is nonenhancing T1 isointense and T2 hypointense thrombus within the walls of the aneurysm. The patent channel through the center of the aneurysm has an irregular margin and a laterally directed cavity extending into the superior aspect of the lateral mural thrombus with a 7 mm base and measuring 9 mm to dome (MRA neck series 31, image 9). The internal carotid artery upstream and downstream to the giant aneurysm is dilated measuring up to 11 mm at the proximal cavernous segment. The aneurysm extends medially into the suprasellar cistern partially effacing the pituitary gland and makes contact on the left optic  nerve with mild mass effect. Laterally directed saccular aneurysm of the left ICA just proximal to the terminus within 8 mm base and measuring 9 mm to dome (MRA series 31, image 21). IMPRESSION: 1. Widening of the extra arachnoid space in the prepontine cistern, right CP cistern, bilateral internal auditory canals, and posterior to right cerebellar hemisphere with intermediate/increased T1 and increased T2 signal. Signal characteristics are suggestive of acute hemorrhage which may be due to recent aneurysm rupture/leak. 2. No subarachnoid hemorrhage identified. 3. CTA of the head is recommended to better delineate arterial and venous structures and assess for interval change. 4. Left cavernous ICA giant fusiform aneurysm measuring approximately 23 mm with partially thrombosed lumen. The patent channel through the aneurysm is irregular with a 9 mm cavity extending laterally into the thrombus. The aneurysm partially effaces the suprasellar cistern and exerts mild mass effect on the left optic nerve. 5. Left terminal ICA laterally directed saccular aneurysm measuring up to 9 mm. 6. Right cavernous ICA laterally directed broad-based saccular aneurysm measuring up to 10 mm. 7. Right  paraophthalmic ICA irregular fusiform aneurysm measuring 8 mm. These results were called by telephone at the time of interpretation on 05/29/2018 at 2:14 am to Dr. Lennice Sites , who verbally acknowledged these results. Electronically Signed   By: Kristine Garbe M.D.   On: 05/29/2018 02:24    Procedures Procedures (including critical care time)  Medications Ordered in ED Medications  iopamidol (ISOVUE-370) 76 % injection (has no administration in time range)  iopamidol (ISOVUE-370) 76 % injection 50 mL (has no administration in time range)   stroke: mapping our early stages of recovery book (has no administration in time range)  0.9 %  sodium chloride infusion ( Intravenous Rate/Dose Verify 05/29/18 1300)  acetaminophen (TYLENOL) tablet 650 mg (has no administration in time range)    Or  acetaminophen (TYLENOL) solution 650 mg (has no administration in time range)    Or  acetaminophen (TYLENOL) suppository 650 mg (has no administration in time range)  docusate sodium (COLACE) capsule 100 mg (100 mg Oral Not Given 05/29/18 0909)  ondansetron (ZOFRAN-ODT) disintegrating tablet 4 mg (has no administration in time range)    Or  ondansetron (ZOFRAN) injection 4 mg (has no administration in time range)  pantoprazole (PROTONIX) EC tablet 40 mg (40 mg Oral Not Given 05/29/18 0909)    Or  pantoprazole sodium (PROTONIX) 40 mg/20 mL oral suspension 40 mg ( Per Tube See Alternative 05/29/18 0909)  nicardipine (CARDENE) 20mg  in 0.86% saline 218ml IV infusion (0.1 mg/ml) (0 mg/hr Intravenous Stopped 05/29/18 0942)  pneumococcal 23 valent vaccine (PNU-IMMUNE) injection 0.5 mL (has no administration in time range)  Influenza vac split quadrivalent PF (FLUZONE HIGH-DOSE) injection 0.5 mL (has no administration in time range)  MEDLINE mouth rinse (15 mLs Mouth Rinse Given 05/29/18 1140)  lactated ringers bolus 1,000 mL (0 mLs Intravenous Stopped 05/06/2018 1911)  acetaminophen (TYLENOL) tablet 650 mg  (650 mg Oral Given 05/15/2018 1848)  iopamidol (ISOVUE-370) 76 % injection (50 mLs  Contrast Given 05/29/18 0307)  iopamidol (ISOVUE-370) 76 % injection 100 mL (100 mLs Intravenous Contrast Given 05/30/2018 2011)  gadobutrol (GADAVIST) 1 MMOL/ML injection 7.5 mL (5 mLs Intravenous Contrast Given 05/29/18 0059)  acetaminophen (OFIRMEV) IV 500 mg (0 mg Intravenous Stopped 05/29/18 0724)  nicardipine (CARDENE) 20mg  in 0.86% saline 249ml IV infusion (0.1 mg/ml) (0 mg/hr Intravenous Stopped 05/29/18 0941)     Initial Impression / Assessment and Plan / ED Course  I have  reviewed the triage vital signs and the nursing notes.  Pertinent labs & imaging results that were available during my care of the patient were reviewed by me and considered in my medical decision making (see chart for details).     Theresa Lutz is an 84 year old female with history of breast cancer in remission who presents to the ED with general weakness.  Patient with unremarkable vitals.  No fever.  Patient diagnosed with a urinary tract infection last week and was started on ciprofloxacin.  She started to develop nausea and vomiting and general malaise.  She is no longer taking antibiotic.  She has had some intermittent headaches.  She denies any chest pain, shortness of breath, abdominal pain.  Overall patient is without symptoms but feels generally weak.  Has normal neurological exam.  Clear breath sounds.  No abdominal tenderness.  Patient appears mildly dehydrated.  Will get labs including EKG, troponin, d-dimer, CT scan of head.  Patient given IV fluids, Tylenol.  Patient with unremarkable EKG, no signs of ischemic changes.  Has PACs.  Troponin within normal limits.  No chest pain.  No cardiac risk factors and doubt ACS.  Patient with d-dimer elevated and CT PE study was obtained that showed no acute PE or other findings.  Chest x-ray showed no signs of pneumonia, pneumothorax, pleural effusion.  Urinalysis with no signs of infection.   No significant anemia, electrolyte abnormality, kidney injury.  CT scan of the head showed hyperdense mass in the region of the left cavernous sinus extending into the left sellar region as discussed on the phone with radiology Dr. Polly Cobia.  They recommend MRI brain and MRA of the brain and neck to further evaluate.    Patient was updated and overall she remains asymptomatic.  Feels improved following fluids.  MRI shows multiple aneurysms with largest aneurysm in the cavernosus ICA on left and there appears to be possible acute hemorrhage possible rupture/leak as discussed on the phone with radiology.  There appears to be some mass-effect possibly in the left optic nerve.  Radiology further recommends a CTA of the head which would allow for further diagnostics as well as look for any interval change from findings found on earlier head CT.  Dr. Oneida Alar with vascular surgery was called to have him look at the images to see if there was any need for vascular involvement as radiology stated that likely vascular surgery would be the one to possibly intervene.  Suspect that patient will also need neurosurgery involvement as well.  Dr. Oneida Alar will look at the images and give back a call to Dr. Tomi Bamberger who took over for my care of this patient at 3 AM.  Patient awaiting CTA head and further recommendations.  Patient remains neurologically stable throughout my care and patient was made aware of findings.  Patient also will need outpatient CT of chest for groundglass opacity in the lungs.  Please see Dr. Johnsie Kindred note for further results, evaluation, disposition of the patient.  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

## 2018-05-28 NOTE — ED Notes (Signed)
Pt. To MRI via stretcher. 

## 2018-05-29 ENCOUNTER — Encounter (HOSPITAL_COMMUNITY): Payer: Self-pay | Admitting: Emergency Medicine

## 2018-05-29 ENCOUNTER — Emergency Department (HOSPITAL_COMMUNITY): Payer: Medicare Other

## 2018-05-29 DIAGNOSIS — H4902 Third [oculomotor] nerve palsy, left eye: Secondary | ICD-10-CM | POA: Diagnosis present

## 2018-05-29 DIAGNOSIS — H02402 Unspecified ptosis of left eyelid: Secondary | ICD-10-CM | POA: Diagnosis present

## 2018-05-29 DIAGNOSIS — N390 Urinary tract infection, site not specified: Secondary | ICD-10-CM | POA: Diagnosis present

## 2018-05-29 DIAGNOSIS — L237 Allergic contact dermatitis due to plants, except food: Secondary | ICD-10-CM | POA: Diagnosis present

## 2018-05-29 DIAGNOSIS — Z9012 Acquired absence of left breast and nipple: Secondary | ICD-10-CM | POA: Diagnosis not present

## 2018-05-29 DIAGNOSIS — I729 Aneurysm of unspecified site: Secondary | ICD-10-CM | POA: Diagnosis not present

## 2018-05-29 DIAGNOSIS — Z79899 Other long term (current) drug therapy: Secondary | ICD-10-CM | POA: Diagnosis not present

## 2018-05-29 DIAGNOSIS — Z885 Allergy status to narcotic agent status: Secondary | ICD-10-CM | POA: Diagnosis not present

## 2018-05-29 DIAGNOSIS — R531 Weakness: Secondary | ICD-10-CM | POA: Diagnosis present

## 2018-05-29 DIAGNOSIS — I959 Hypotension, unspecified: Secondary | ICD-10-CM | POA: Diagnosis not present

## 2018-05-29 DIAGNOSIS — I609 Nontraumatic subarachnoid hemorrhage, unspecified: Secondary | ICD-10-CM | POA: Diagnosis not present

## 2018-05-29 DIAGNOSIS — Z853 Personal history of malignant neoplasm of breast: Secondary | ICD-10-CM | POA: Diagnosis not present

## 2018-05-29 DIAGNOSIS — I671 Cerebral aneurysm, nonruptured: Secondary | ICD-10-CM | POA: Diagnosis present

## 2018-05-29 DIAGNOSIS — Z91018 Allergy to other foods: Secondary | ICD-10-CM | POA: Diagnosis not present

## 2018-05-29 DIAGNOSIS — G911 Obstructive hydrocephalus: Secondary | ICD-10-CM | POA: Diagnosis not present

## 2018-05-29 LAB — MRSA PCR SCREENING: MRSA BY PCR: NEGATIVE

## 2018-05-29 LAB — PROTIME-INR
INR: 0.98
Prothrombin Time: 12.9 seconds (ref 11.4–15.2)

## 2018-05-29 LAB — APTT: APTT: 27 s (ref 24–36)

## 2018-05-29 MED ORDER — ASPIRIN 325 MG PO TABS
325.0000 mg | ORAL_TABLET | Freq: Every day | ORAL | Status: DC
  Administered 2018-05-30 – 2018-05-31 (×2): 325 mg via ORAL
  Filled 2018-05-29 (×2): qty 1

## 2018-05-29 MED ORDER — PANTOPRAZOLE SODIUM 40 MG PO PACK
40.0000 mg | PACK | Freq: Every day | ORAL | Status: DC
  Filled 2018-05-29: qty 20

## 2018-05-29 MED ORDER — DOCUSATE SODIUM 100 MG PO CAPS
100.0000 mg | ORAL_CAPSULE | Freq: Two times a day (BID) | ORAL | Status: DC
  Administered 2018-05-30: 100 mg via ORAL
  Filled 2018-05-29 (×2): qty 1

## 2018-05-29 MED ORDER — NICARDIPINE HCL IN NACL 20-0.86 MG/200ML-% IV SOLN
0.0000 mg/h | INTRAVENOUS | Status: DC
  Administered 2018-05-29: 1.5 mg/h via INTRAVENOUS
  Administered 2018-05-29 – 2018-05-31 (×3): 2.5 mg/h via INTRAVENOUS
  Filled 2018-05-29 (×2): qty 200

## 2018-05-29 MED ORDER — STROKE: EARLY STAGES OF RECOVERY BOOK
Freq: Once | Status: AC
  Administered 2018-05-29: 15:00:00
  Filled 2018-05-29: qty 1

## 2018-05-29 MED ORDER — ASPIRIN 325 MG PO TABS
650.0000 mg | ORAL_TABLET | Freq: Once | ORAL | Status: AC
  Administered 2018-05-29: 650 mg via ORAL
  Filled 2018-05-29: qty 2

## 2018-05-29 MED ORDER — NICARDIPINE HCL IN NACL 20-0.86 MG/200ML-% IV SOLN
3.0000 mg/h | Freq: Once | INTRAVENOUS | Status: AC
  Administered 2018-05-29: 5 mg/h via INTRAVENOUS
  Filled 2018-05-29: qty 200

## 2018-05-29 MED ORDER — ACETAMINOPHEN 160 MG/5ML PO SOLN: 650.0000 mg | ORAL | Status: DC | PRN

## 2018-05-29 MED ORDER — ACETAMINOPHEN 650 MG RE SUPP: 650.0000 mg | RECTAL | Status: DC | PRN

## 2018-05-29 MED ORDER — SODIUM CHLORIDE 0.9 % IV SOLN
INTRAVENOUS | Status: DC
  Administered 2018-05-29 – 2018-05-30 (×3): via INTRAVENOUS

## 2018-05-29 MED ORDER — ONDANSETRON HCL 4 MG/2ML IJ SOLN: 4.0000 mg | Freq: Four times a day (QID) | INTRAMUSCULAR | Status: DC | PRN

## 2018-05-29 MED ORDER — IOPAMIDOL (ISOVUE-370) INJECTION 76%
INTRAVENOUS | Status: AC
  Filled 2018-05-29: qty 50

## 2018-05-29 MED ORDER — CLOPIDOGREL BISULFATE 75 MG PO TABS
300.0000 mg | ORAL_TABLET | Freq: Once | ORAL | Status: AC
  Administered 2018-05-29: 300 mg via ORAL
  Filled 2018-05-29: qty 4

## 2018-05-29 MED ORDER — PNEUMOCOCCAL VAC POLYVALENT 25 MCG/0.5ML IJ INJ: 0.5000 mL | INJECTION | INTRAMUSCULAR | Status: DC

## 2018-05-29 MED ORDER — PANTOPRAZOLE SODIUM 40 MG PO TBEC
40.0000 mg | DELAYED_RELEASE_TABLET | Freq: Every day | ORAL | Status: DC
  Administered 2018-05-30: 40 mg via ORAL
  Filled 2018-05-29: qty 1

## 2018-05-29 MED ORDER — CLOPIDOGREL BISULFATE 75 MG PO TABS
75.0000 mg | ORAL_TABLET | Freq: Every day | ORAL | Status: DC
  Administered 2018-05-30 – 2018-05-31 (×2): 75 mg via ORAL
  Filled 2018-05-29 (×2): qty 1

## 2018-05-29 MED ORDER — ACETAMINOPHEN 10 MG/ML IV SOLN
500.0000 mg | Freq: Once | INTRAVENOUS | Status: AC
  Administered 2018-05-29: 500 mg via INTRAVENOUS
  Filled 2018-05-29 (×4): qty 100

## 2018-05-29 MED ORDER — ONDANSETRON 4 MG PO TBDP: 4.0000 mg | ORAL_TABLET | Freq: Four times a day (QID) | ORAL | Status: DC | PRN

## 2018-05-29 MED ORDER — GADOBUTROL 1 MMOL/ML IV SOLN
7.5000 mL | Freq: Once | INTRAVENOUS | Status: AC | PRN
  Administered 2018-05-29: 5 mL via INTRAVENOUS

## 2018-05-29 MED ORDER — ACETAMINOPHEN 325 MG PO TABS: 650.0000 mg | ORAL_TABLET | ORAL | Status: DC | PRN

## 2018-05-29 MED ORDER — IOPAMIDOL (ISOVUE-370) INJECTION 76%: 50.0000 mL | Freq: Once | INTRAVENOUS | Status: DC | PRN

## 2018-05-29 MED ORDER — ORAL CARE MOUTH RINSE
15.0000 mL | Freq: Two times a day (BID) | OROMUCOSAL | Status: DC
  Administered 2018-05-29: 15 mL via OROMUCOSAL

## 2018-05-29 MED ORDER — INFLUENZA VAC SPLIT HIGH-DOSE 0.5 ML IM SUSY
0.5000 mL | PREFILLED_SYRINGE | INTRAMUSCULAR | Status: DC
  Filled 2018-05-29: qty 0.5

## 2018-05-29 NOTE — Consult Note (Addendum)
Chief Complaint   Chief Complaint  Patient presents with  . Weakness    History of Present Illness  Theresa Lutz is a 84 y.o. female presenting to the ED with significantly worsening headache.  Her history actually begins about a week and a half ago when she says she got poison ivy.  She was started on a prednisone course.  A few days after this, she thought she had the beginning of a urinary tract infection and was started on Cipro.  She says immediately after starting the Cipro she had a reaction to it causing significant nausea and vomiting requiring a trip to the emergency department.  About 4 or 5 days after this, she began noticing intermittent but progressively worsening headaches.  Headaches became severe enough that she came back to the emergency department last night.  She says at that time she was feeling generalized malaise, weakness, and had a headache.  In addition, both she and her family noted that she began to have a partially drooping left eyelid.  She did not note any focal weakness, numbness, tingling.  Of note, the patient is relatively healthy at baseline, without any history of hypertension, heart disease, or stroke.  There is no family history of aneurysms.  She is a non-smoker.    Past Medical History   Past Medical History:  Diagnosis Date  . Angio-edema   . Breast CA ([place])   . Breast cancer, right breast ([place])   . Bruises easily   . Cancer ([place])    Left Br  . Urticaria     Past Surgical History   Past Surgical History:  Procedure Laterality Date  . APPENDECTOMY    . BREAST LUMPECTOMY WITH RADIOACTIVE SEED AND SENTINEL LYMPH NODE BIOPSY Right 03/04/2015   Procedure: RIGHT RADIOACTIVE SEED GUIDED BREAST LUMPECTOMY AND RIGHT AXILLARY SENTINEL LYMPH NODE BIOPSY;  Surgeon: [name], MD;  Location: [facility];  Service: General;  Laterality: Right;  . BREAST SURGERY  10/04/1994    partial left breast mastectomy  . CATARACT EXTRACTION      Social History    Social History   Tobacco Use  . Smoking status: Never Smoker  . Smokeless tobacco: Never Used  Substance Use Topics  . Alcohol use: Yes    Comment: rarely  . Drug use: No    Medications   Prior to Admission medications   Medication Sig Start Date End Date Taking? Authorizing Provider  anastrozole (ARIMIDEX) 1 MG tablet TAKE 1 TABLET ONCE DAILY. 05/16/18  Yes [name], [name], MD  Calcium Carb-Cholecalciferol (CALCIUM 500+D) 500-400 MG-UNIT TABS Take 1 tablet by mouth 1 day or 1 dose. 04/12/17  Yes [name], [name], MD  Multiple Minerals-Vitamins (CALCIUM & VIT D3 BONE HEALTH PO) Take by mouth.   Yes [provider]    Allergies   Allergies  Allergen Reactions  . Beef-Derived Products   . Hydrocodone Nausea And Vomiting  . Pork-Derived Products     Review of Systems  ROS  Neurologic Exam  Awake, alert, oriented Memory and concentration grossly intact Speech fluent, appropriate CN grossly intact x partial left ptosis Motor exam: Upper Extremities Deltoid Bicep Tricep Grip  Right 5/5 5/5 5/5 5/5  Left 5/5 5/5 5/5 5/5   Lower Extremities IP Quad PF DF EHL  Right 5/5 5/5 5/5 5/5 5/5  Left 5/5 5/5 5/5 5/5 5/5   Sensation grossly intact to LT  Imaging  CT of the head was reviewed which demonstrates what  appears to be a partially thrombosed left carotid aneurysm.  There is no identifiable subarachnoid hemorrhage.  There is no hydrocephalus.  MRI of the brain was reviewed.  This again does not demonstrate any evidence of subarachnoid hemorrhage.  There is T2 and flair hyperintense signal along the clivus, and the petrosal surface of the posterior fossa on the right, which may be a thin layer of blood.  There is also partially thrombosed aneurysm within the left cavernous sinus.  MRA and CTA of the head and neck were reviewed.  These demonstrate fusiform aneurysm of the right cavernous and supraclinoid internal carotid artery.  Also seen is a partially  thrombosed fusiform aneurysm of the left intracranial carotid extending from approximately the petrous segment, through the supraclinoid segment including a wide-based saccular component of the supraclinoid internal carotid artery.  Impression  - 84 y.o. female presenting with headache, and new partial left 3rd nerve palsy.  There is also MRI evidence of possible small volume blood products in the posterior fossa.  In this setting, I would certainly be concerned that there is risk of full aneurysm rupture and subarachnoid hemorrhage in the near future.  I therefore would certainly recommend treatment of the left-sided aneurysm as this appears to be the symptomatic side.  Given the fusiform nature of the aneurysm, I think the only viable treatment option would be flow diversion.  Obviously, this would require dual antiplatelet therapy.  Plan  -We will plan on proceeding with diagnostic cerebral angiogram, with intention to treat the fusiform left carotid aneurysm with the pipeline device likely extending from the left M1 into the petrous or distal cervical internal carotid artery likely with a multi-stent telescoping construct. -I will plan on loading her with aspirin Plavix starting tonight, and we will plan on flow diversion treatment in approximately 36 hours, Wednesday morning. - Keep her SBP < 152mmHg, PRN nicardipene gtt  I did review the imaging findings with both the patient and her 2 daughters at the bedside.  I told him that given her clinical symptoms including the headache, ptosis, and the radiographic findings, that I am concerned that there could be full aneurysm rupture in the near future.  I therefore did recommend treatment despite her age.  We did also briefly discussed the alternative treatment strategy of expectant observation, and the inherent risk of possible future rupture.  We reviewed the details of the endovascular treatment, and expected postoperative course.  The risks of the  procedure were also reviewed in detail including the primary risk of stroke leading to possible numbness, weakness, paralysis, speech deficit, coma, or death.  We also discussed risk of hemorrhage, arterial injury, contrast reaction, and nephropathy.  I reviewed the need for preoperative and postoperative dual antiplatelet therapy with aspirin and Plavix.  Both the patient and her daughters appear to understand our discussion.  They asked appropriate questions which were answered.  They are willing to proceed with treatment.

## 2018-05-29 NOTE — Consult Note (Signed)
Asked by ER to review MR images on pt.  I reviewed the MRA of the head and neck.  The extracranial common external and internal cervical portions of the carotid are normal.  She has intracranial aneurysms.  These are not accessible by vascular surgery.  Would recommend Neurosurgery to eval.  Ruta Hinds, MD [facility] Office: [phone] Pager: [phone]

## 2018-05-29 NOTE — H&P (Signed)
NAME:  Theresa Lutz, MRN:  [id number], DOB:  05-Oct-1932, LOS: 0 ADMISSION DATE:  05/21/2018, CONSULTATION DATE:  05/29/2018 REFERRING MD:  Dr. Lamar Sprinkles, ER, CHIEF COMPLAINT:  Headache   Brief History   84 yo female with headache.  Found to have Lt cavernous ICA giant fusiform aneurysm with partial thrombosis, Lt ICA saccular aneurysm, Rt cavernous ICA saccular aneurysm, Rt paraophthalmic ICA fusiform aneurysm.  After ER discussed with vascular surgery, neurology, neuro-IR, and neurosurgery it was determined that PCCM needed to admit to ICU to write order for nicardipine with goal SBP < 150.  Significant Hospital Events   9/24 Admit  Consults: date of consult/date signed off & final recs:  Neuro IR 9/24 >> Neurosurgery 9/24 >>  Procedures (surgical and bedside):   Significant Diagnostic Tests:  MRI brain 9/24 >> Lt cavernous ICA giant fusiform aneurysm with partial thrombosis, Lt ICA saccular aneurysm, Rt cavernous ICA saccular aneurysm, Rt paraophthalmic ICA fusiform aneurysm CT angio chest 9/24 >> 10 mm GGO nodule lingula  Subjective:  Feels like her body is falling apart.  Objective   Blood pressure (!) 149/86, pulse 60, temperature 98.6 F (37 C), temperature source Oral, resp. rate 18, height 5\' 4"  (1.626 m), weight 49.9 kg, SpO2 90 %.        Intake/Output Summary (Last 24 hours) at 05/29/2018 0703 Last data filed at 05/21/2018 1911 Gross per 24 hour  Intake 1000 ml  Output -  Net 1000 ml   Filed Weights   05/07/2018 1748  Weight: 49.9 kg    Examination:  General - alert Eyes - pupils reactive ENT - no sinus tenderness, no stridor Cardiac - regular rate/rhythm, no murmur Chest - equal breath sounds b/l, no wheezing or rales Abdomen - soft, non tender, + bowel sounds, no hepatosplenomegaly GU - no lesions noted Extremities - no cyanosis, clubbing, or edema Skin - no rashes Lymphatics - no lymphadenopathy Neuro - CN intact, normal strength, moves extremities, follows  commands Psych - normal mood and behavior     Resolved Hospital Problem list    Assessment & Plan:  Cavernous sinus aneurysm. - admit to ICU - neurosurgery and neuro IR consulted by EDP - PCCM to write order for cardene with goal SBP < 150 - [facility] order set used  Hx of breast cancer. - hold arimidex while NPO  Lingular ground glass nodule. - will need f/u imaging as outpt  Disposition / Summary of Today's Plan 05/29/18   PCCM has written order for cardene.    Diet: NPO DVT prophylaxis: SCD GI prophylaxis: Protonix Mobility:Bed rest Code Status: full code Family Communication: updated family at bedside  Labs   CBC: Recent Labs  Lab 05/20/2018 1743  WBC 9.0  NEUTROABS 6.4  HGB 15.5*  HCT 45.7  MCV 102.9*  PLT 409   Basic Metabolic Panel: Recent Labs  Lab 05/22/2018 1743  NA 131*  K 3.3*  CL 93*  CO2 25  GLUCOSE 93  BUN 7*  CREATININE 0.59  CALCIUM 9.2   GFR: Estimated Creatinine Clearance: 42 mL/min (by C-G formula based on SCr of 0.59 mg/dL). Recent Labs  Lab 05/17/2018 1743  WBC 9.0   Liver Function Tests: Recent Labs  Lab 05/10/2018 1743  AST 16  ALT 10  ALKPHOS 50  BILITOT 1.0  PROT 5.8*  ALBUMIN 2.9*   Recent Labs  Lab 05/15/2018 1743  LIPASE 28   No results for input(s): AMMONIA in the last 168 hours. ABG No results  found for: PHART, PCO2ART, PO2ART, HCO3, TCO2, ACIDBASEDEF, O2SAT  Coagulation Profile: No results for input(s): INR, PROTIME in the last 168 hours. Cardiac Enzymes: No results for input(s): CKTOTAL, CKMB, CKMBINDEX, TROPONINI in the last 168 hours. HbA1C: No results found for: HGBA1C CBG: No results for input(s): GLUCAP in the last 168 hours.  Admitting History of Present Illness.   84 yo female with headache, nausea with vomiting for 2 days.  Had recent tx for poison ivy and UTI.  Found to have Lt cavernous ICA giant fusiform aneurysm with partial thrombosis, Lt ICA saccular aneurysm, Rt cavernous ICA saccular  aneurysm, Rt paraophthalmic ICA fusiform aneurysm.  After ER discussed with vascular surgery, neurology, neuro-IR, and neurosurgery it was determined that PCCM needed to admit to ICU to write order for nicardipine with goal SBP < 150.  Review of Systems:   Negative except in HPI.  Past medical history  She,  has a past medical history of Angio-edema, Breast CA ([place]), Breast cancer, right breast ([place]), Bruises easily, Cancer ([place]), and Urticaria.   Surgical History    Past Surgical History:  Procedure Laterality Date  . APPENDECTOMY    . BREAST LUMPECTOMY WITH RADIOACTIVE SEED AND SENTINEL LYMPH NODE BIOPSY Right 03/04/2015   Procedure: RIGHT RADIOACTIVE SEED GUIDED BREAST LUMPECTOMY AND RIGHT AXILLARY SENTINEL LYMPH NODE BIOPSY;  Surgeon: [name], MD;  Location: [facility];  Service: General;  Laterality: Right;  . BREAST SURGERY  10/04/1994    partial left breast mastectomy  . CATARACT EXTRACTION       Social History   Social History   Socioeconomic History  . Marital status: Widowed    Spouse name: Not on file  . Number of children: 5  . Years of education: Not on file  . Highest education level: Not on file  Occupational History  . Not on file  Social Needs  . Financial resource strain: Not on file  . Food insecurity:    Worry: Not on file    Inability: Not on file  . Transportation needs:    Medical: Not on file    Non-medical: Not on file  Tobacco Use  . Smoking status: Never Smoker  . Smokeless tobacco: Never Used  Substance and Sexual Activity  . Alcohol use: Yes    Comment: rarely  . Drug use: No  . Sexual activity: Not on file  Lifestyle  . Physical activity:    Days per week: Not on file    Minutes per session: Not on file  . Stress: Not on file  Relationships  . Social connections:    Talks on phone: Not on file    Gets together: Not on file    Attends religious service: Not on file    Active member of club or organization: Not on file    Attends  meetings of clubs or organizations: Not on file    Relationship status: Not on file  . Intimate partner violence:    Fear of current or ex partner: Not on file    Emotionally abused: Not on file    Physically abused: Not on file    Forced sexual activity: Not on file  Other Topics Concern  . Not on file  Social History Narrative  . Not on file  ,  reports that she has never smoked. She has never used smokeless tobacco. She reports that she drinks alcohol. She reports that she does not use drugs.   Family history   Her  family history includes Lung cancer in her father; Stroke in her mother.   Allergies Allergies  Allergen Reactions  . Hydrocodone Nausea And Vomiting    Home meds  Prior to Admission medications   Medication Sig Start Date End Date Taking? Authorizing Provider  anastrozole (ARIMIDEX) 1 MG tablet TAKE 1 TABLET ONCE DAILY. 05/16/18  Yes [name], [name], MD  Calcium Carb-Cholecalciferol (CALCIUM 500+D) 500-400 MG-UNIT TABS Take 1 tablet by mouth 1 day or 1 dose. 04/12/17  Yes [name], [name], MD  Multiple Minerals-Vitamins (CALCIUM & VIT D3 BONE HEALTH PO) Take by mouth.   Yes [provider]    Chesley Mires, MD [facility] Pulmonary/Critical Care 05/29/2018, 7:39 AM

## 2018-05-29 NOTE — Progress Notes (Signed)
  NEUROSURGERY PROGRESS NOTE   EMR reviewed, admission history reviewed. Pt presented with generalized weakness, recently dx with UTI. I have reviewed the head CT, CTA, and MRI/MRA which demonstrate bilateral fusiform cavernous/supraclinoid ICA aneurysms including partially thrombosed likely giant LICA aneurysm. May be amenable to flow-diversion treatment. Full consult to follow.

## 2018-05-29 NOTE — ED Notes (Signed)
Pt. Return from CT via stretcher. 

## 2018-05-29 NOTE — ED Notes (Signed)
Pt. To CT via stretcher. 

## 2018-05-29 NOTE — ED Provider Notes (Signed)
Patient left a change of shift to get the results of her CTA of the brain and to decide her disposition.  I had overheard the conversation with the radiologist who recommended vascular evaluate patient.  Dr. Oneida Alar was contacted.  2:43 AM Dr. Oneida Alar, vascular, called back after reviewing her scans.  He states all of the patient's problems are intracranial and she does not need his services.  04:00 AM Dr Rory Percy is in the ED, he will look at her scan  04:30 Radiologist called back her CTA results, recommends discussion with neurointerventional radiology which is Dr Earleen Newport  04:43 AM Dr Earleen Newport, will come to hospital to review her films, discussed her BP and recommends keeping her BP under 981 systolic.   19:14 AM Family and patient aware of updates. Pt getting IV acetaminophen. She was kept NPO, and started on nicardipine for BP control. She is now c/o left sided headache.   5:41 AM Dr. Carman Ching, has reviewed her scans.  He states she needs to be admitted to the medical service for blood pressure control.  He also recommends consulting neurosurgery.  He states they will need to coordinate between interventional radiology and neurosurgery the next best step for treatment of her aneurysms.  06:07 AM Dr Myna Hidalgo, hospitalist, states they are not allowed to admit patient's on a nicardipine drip.   06:33 AM Dr Saintclair Halsted, neurosurgeon, made aware of patient, will follow  06:34  Dr Emmit Alexanders, PCCM, will "put on the list" to be admitted.   07:05 AM patient and family advised she would be admitted and neurosurgery and interventional neuroradiology will decide on a treatment plan for her aneurysms. Her blood pressure is being treated with nicardipine and her BP is currently 149/86.   Ct Angio Head W Or Wo Contrast  Result Date: 05/29/2018 CLINICAL DATA:  84 y/o  F; intracranial aneurysms.  IMPRESSION: 1. Arising from the proximal left cavernous ICA is a thin wisp of enhancement extending into the widened  retroclival/prepontine extra arachnoid space which appears subtly increased in thickness and density in comparison with the prior CT of head. Combined with abnormal signal on MR suggesting acute blood products, findings are suspicious for a small aneurysm rupture/leak. 2. 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 3. Large hyperdense mass surrounding the left cavernous ICA fusiform aneurysm likely representing the thrombosed lumen of a giant aneurysm. No definite enhancement after administration of intravenous contrast. To fully exclude underlying causative mass, MRI of the head with and without contrast would be the study of choice. Electronically Signed   By: Kristine Garbe M.D.   On: 05/29/2018 04:35   Ct Head Wo Contrast  Addendum Date: 06/04/2018   ADDENDUM REPORT: 05/23/2018 20:46 ADDENDUM: Clarification: Further evaluation recommended with MRI brain without and with IV contrast and MR angiography of the head & neck. These results were called by telephone at the time of interpretation on 05/10/2018 at 8:42 pm to Dr. Lennice Sites, who verbally acknowledged these results. Electronically Signed   By: Ilona Sorrel M.D.   On: 05/17/2018 20:46   Result Date: 05/24/2018 CLINICAL DATA:  Acute headache. Nausea and vomiting. No reported injury. s. IMPRESSION: 1. Hyperdense 2.6 x 1.9 cm mass in the region of the left cavernous sinus extending into the left sellar region. Differential includes meningioma, left ICA aneurysm or pituitary mass. MRI brain without and with  IV contrast recommended for further characterization. 2. Moderate chronic small vessel ischemic changes in the cerebral white matter. 3. Minimal sphenoid sinusitis. 4. Nonspecific tiny right mastoid effusion. Electronically Signed: By: Ilona Sorrel M.D. On: 05/30/2018 20:31      Mr Virgel Paling OE Contrast Mr Angiogram Neck W Or Wo Contrast Mr Brain Wo Contrast  Result Date: 05/29/2018 CLINICAL DATA:  84 y/o F; history of breast cancer presenting with generalized weakness. Acute headache. Abnormal CT of head. IMPRESSION: 1. Widening of the extra arachnoid space in the prepontine cistern, right CP cistern, bilateral internal auditory canals, and posterior to right cerebellar hemisphere with intermediate/increased T1 and increased T2 signal. Signal characteristics are suggestive of acute hemorrhage which may be due to recent aneurysm rupture/leak. 2. No subarachnoid hemorrhage identified. 3. CTA of the head is recommended to better delineate arterial and venous structures and assess for interval change. 4. Left cavernous ICA giant fusiform aneurysm measuring approximately 23 mm with partially thrombosed lumen. The patent channel through the aneurysm is irregular with a 9 mm cavity extending laterally into the thrombus. The aneurysm partially effaces the suprasellar cistern and exerts mild mass effect on the left optic nerve. 5. Left terminal ICA laterally directed saccular aneurysm measuring up to 9 mm. 6. Right cavernous ICA laterally directed broad-based saccular aneurysm measuring up to 10 mm. 7. Right paraophthalmic ICA irregular fusiform aneurysm measuring 8 mm. These results were called by telephone at the time of interpretation on 05/29/2018 at 2:14 am to Dr. Lennice Sites , who verbally acknowledged these results. Electronically Signed   By: Kristine Garbe M.D.   On: 05/29/2018 02:24   CRITICAL CARE Performed by: Charrie Mcconnon L Anner Baity Total critical care time: 38 minutes Critical care time was exclusive of separately billable procedures and treating other patients. Critical care was necessary to treat or prevent imminent or life-threatening deterioration. Critical care was time spent personally by me on the following activities: development of treatment plan with patient  and/or surrogate as well as nursing, discussions with consultants, evaluation of patient's response to treatment, examination of patient, obtaining history from patient or surrogate, ordering and performing treatments and interventions, ordering and review of laboratory studies, ordering and review of radiographic studies, pulse oximetry and re-evaluation of patient's condition.   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

## 2018-05-30 LAB — URINE CULTURE

## 2018-05-30 LAB — CBC
HCT: 45.1 % (ref 36.0–46.0)
Hemoglobin: 16.1 g/dL — ABNORMAL HIGH (ref 12.0–15.0)
MCH: 35.9 pg — AB (ref 26.0–34.0)
MCHC: 35.7 g/dL (ref 30.0–36.0)
MCV: 100.7 fL — AB (ref 78.0–100.0)
Platelets: 213 10*3/uL (ref 150–400)
RBC: 4.48 MIL/uL (ref 3.87–5.11)
RDW: 11.9 % (ref 11.5–15.5)
WBC: 10.4 10*3/uL (ref 4.0–10.5)

## 2018-05-30 LAB — BASIC METABOLIC PANEL
ANION GAP: 14 (ref 5–15)
BUN: 8 mg/dL (ref 8–23)
CO2: 18 mmol/L — AB (ref 22–32)
Calcium: 8.8 mg/dL — ABNORMAL LOW (ref 8.9–10.3)
Chloride: 94 mmol/L — ABNORMAL LOW (ref 98–111)
Creatinine, Ser: 0.45 mg/dL (ref 0.44–1.00)
GFR calc non Af Amer: >60 mL/min (ref >60)
Glucose, Bld: 93 mg/dL (ref 70–99)
Potassium: 3.9 mmol/L (ref 3.5–5.1)
Sodium: 126 mmol/L — ABNORMAL LOW (ref 135–145)

## 2018-05-30 MED ORDER — HYDROCORTISONE 1 % EX CREA
TOPICAL_CREAM | Freq: Two times a day (BID) | CUTANEOUS | Status: DC | PRN
  Administered 2018-05-30: 1 via TOPICAL
  Filled 2018-05-30: qty 28

## 2018-05-30 MED ORDER — DIPHENHYDRAMINE HCL 25 MG PO CAPS
25.0000 mg | ORAL_CAPSULE | Freq: Four times a day (QID) | ORAL | Status: DC | PRN
  Administered 2018-05-30: 25 mg via ORAL
  Filled 2018-05-30: qty 1

## 2018-05-30 MED ORDER — HYDROCORTISONE 1 % EX LOTN
TOPICAL_LOTION | Freq: Two times a day (BID) | CUTANEOUS | Status: DC | PRN
  Filled 2018-05-30: qty 118

## 2018-05-30 NOTE — Progress Notes (Signed)
  NEUROSURGERY PROGRESS NOTE   No issues overnight. No complaints this am.  EXAM:  BP 140/87   Pulse 67   Temp (!) 97.5 F (36.4 C) (Oral)   Resp 18   Ht 5\' 4"  (1.626 m)   Wt 49.9 kg   SpO2 96%   BMI 18.88 kg/m   Awake, alert, oriented  Speech fluent, appropriate  CN grossly intact x left partial ptosis 5/5 BUE/BLE   IMPRESSION:  84 y.o. female with fusiform symptomatic partially thrombosed left ICA aneurysm Concurrent RICA fusiform aneurysm  PLAN: - Cont ASA/Plavix, plan on flow-diversion treatment tomorrow am.  Reviewed the plan again with the patient. All questions answered.

## 2018-05-30 NOTE — Progress Notes (Signed)
D/w Dr. Kathyrn Sheriff.  He has graciously offered to accept primary service for patient during her hospital stay.  PCCM can be available as needed.  Please call if further assistance required while she is still in hospital.  Chesley Mires, MD [place] 05/30/2018, 8:23 AM

## 2018-05-31 ENCOUNTER — Inpatient Hospital Stay (HOSPITAL_COMMUNITY): Payer: Medicare Other

## 2018-05-31 ENCOUNTER — Inpatient Hospital Stay (HOSPITAL_COMMUNITY): Payer: Medicare Other | Admitting: Certified Registered"

## 2018-05-31 ENCOUNTER — Encounter (HOSPITAL_COMMUNITY): Admission: EM | Disposition: E | Payer: Self-pay | Source: Home / Self Care | Attending: Neurosurgery

## 2018-05-31 ENCOUNTER — Inpatient Hospital Stay (HOSPITAL_COMMUNITY)
Admit: 2018-05-31 | Discharge: 2018-05-31 | Disposition: A | Discharge status: Home / Self Care | Payer: Medicare Other | Attending: Neurosurgery | Admitting: Neurosurgery

## 2018-05-31 ENCOUNTER — Encounter (HOSPITAL_COMMUNITY): Payer: Self-pay | Admitting: Anesthesiology

## 2018-05-31 HISTORY — PX: IR TRANSCATH/EMBOLIZ: IMG695

## 2018-05-31 HISTORY — PX: IR ANGIOGRAM FOLLOW UP STUDY: IMG697

## 2018-05-31 HISTORY — PX: RADIOLOGY WITH ANESTHESIA: SHX6223

## 2018-05-31 HISTORY — PX: IR ANGIO INTRA EXTRACRAN SEL INTERNAL CAROTID UNI L MOD SED: IMG5361

## 2018-05-31 LAB — POCT I-STAT 3, ART BLOOD GAS (G3+)
Acid-base deficit: 12 mmol/L — ABNORMAL HIGH (ref 0.0–2.0)
Bicarbonate: 16.7 mmol/L — ABNORMAL LOW (ref 20.0–28.0)
O2 Saturation: 100 %
PCO2 ART: 45.3 mmHg (ref 32.0–48.0)
PO2 ART: 427 mmHg — AB (ref 83.0–108.0)
Patient temperature: 98.6
TCO2: 18 mmol/L — ABNORMAL LOW (ref 22–32)
pH, Arterial: 7.175 — CL (ref 7.350–7.450)

## 2018-05-31 SURGERY — IR WITH ANESTHESIA
Anesthesia: General

## 2018-05-31 MED ORDER — LACTATED RINGERS IV SOLN
INTRAVENOUS | Status: DC | PRN
  Administered 2018-05-31: 11:00:00 via INTRAVENOUS

## 2018-05-31 MED ORDER — HEPARIN SODIUM (PORCINE) 1000 UNIT/ML IJ SOLN
INTRAMUSCULAR | Status: DC | PRN
  Administered 2018-05-31: 1000 [IU] via INTRAVENOUS
  Administered 2018-05-31: 3000 [IU] via INTRAVENOUS
  Administered 2018-05-31: 2000 [IU] via INTRAVENOUS

## 2018-05-31 MED ORDER — FAMOTIDINE IN NACL 20-0.9 MG/50ML-% IV SOLN
20.0000 mg | INTRAVENOUS | Status: AC
  Administered 2018-05-31: 20 mg via INTRAVENOUS
  Filled 2018-05-31: qty 50

## 2018-05-31 MED ORDER — BISACODYL 10 MG RE SUPP: 10.0000 mg | Freq: Every day | RECTAL | Status: DC | PRN

## 2018-05-31 MED ORDER — DEXMEDETOMIDINE HCL IN NACL 200 MCG/50ML IV SOLN: 0.4000 ug/kg/h | INTRAVENOUS | Status: DC

## 2018-05-31 MED ORDER — DOCUSATE SODIUM 50 MG/5ML PO LIQD: 100.0000 mg | Freq: Two times a day (BID) | ORAL | Status: DC | PRN

## 2018-05-31 MED ORDER — MIDAZOLAM HCL 2 MG/2ML IJ SOLN
1.0000 mg | INTRAMUSCULAR | Status: DC | PRN
  Filled 2018-05-31: qty 2

## 2018-05-31 MED ORDER — LABETALOL HCL 5 MG/ML IV SOLN: 10.0000 mg | INTRAVENOUS | Status: DC | PRN

## 2018-05-31 MED ORDER — LIDOCAINE 2% (20 MG/ML) 5 ML SYRINGE
INTRAMUSCULAR | Status: DC | PRN
  Administered 2018-05-31: 50 mg via INTRAVENOUS

## 2018-05-31 MED ORDER — FENTANYL CITRATE (PF) 100 MCG/2ML IJ SOLN
INTRAMUSCULAR | Status: DC | PRN
  Administered 2018-05-31: 25 ug via INTRAVENOUS
  Administered 2018-05-31: 100 ug via INTRAVENOUS
  Administered 2018-05-31: 25 ug via INTRAVENOUS

## 2018-05-31 MED ORDER — LACTATED RINGERS IV SOLN: INTRAVENOUS | Status: DC

## 2018-05-31 MED ORDER — IOHEXOL 300 MG/ML  SOLN
150.0000 mL | Freq: Once | INTRAMUSCULAR | Status: AC | PRN
  Administered 2018-05-31: 75 mL via INTRA_ARTERIAL

## 2018-05-31 MED ORDER — FENTANYL CITRATE (PF) 100 MCG/2ML IJ SOLN: 50.0000 ug | INTRAMUSCULAR | Status: DC | PRN

## 2018-05-31 MED ORDER — FENTANYL CITRATE (PF) 100 MCG/2ML IJ SOLN
50.0000 ug | INTRAMUSCULAR | Status: DC
  Filled 2018-05-31: qty 2

## 2018-05-31 MED ORDER — DIPHENHYDRAMINE HCL 50 MG/ML IJ SOLN
INTRAMUSCULAR | Status: DC | PRN
  Administered 2018-05-31: 12.5 mg via INTRAVENOUS

## 2018-05-31 MED ORDER — ROCURONIUM BROMIDE 10 MG/ML (PF) SYRINGE
PREFILLED_SYRINGE | INTRAVENOUS | Status: DC | PRN
  Administered 2018-05-31: 50 mg via INTRAVENOUS

## 2018-05-31 MED ORDER — CHLORHEXIDINE GLUCONATE 0.12% ORAL RINSE (MEDLINE KIT): 15.0000 mL | Freq: Two times a day (BID) | OROMUCOSAL | Status: DC

## 2018-05-31 MED ORDER — HYDRALAZINE HCL 20 MG/ML IJ SOLN
INTRAMUSCULAR | Status: AC
  Filled 2018-05-31: qty 1

## 2018-05-31 MED ORDER — ONDANSETRON HCL 4 MG/2ML IJ SOLN
INTRAMUSCULAR | Status: DC | PRN
  Administered 2018-05-31: 4 mg via INTRAVENOUS

## 2018-05-31 MED ORDER — HYDRALAZINE HCL 20 MG/ML IJ SOLN
5.0000 mg | INTRAMUSCULAR | Status: DC | PRN
  Administered 2018-05-31 (×3): 5 mg via INTRAVENOUS

## 2018-05-31 MED ORDER — PHENYLEPHRINE 40 MCG/ML (10ML) SYRINGE FOR IV PUSH (FOR BLOOD PRESSURE SUPPORT)
PREFILLED_SYRINGE | INTRAVENOUS | Status: DC | PRN
  Administered 2018-05-31: 40 ug via INTRAVENOUS

## 2018-05-31 MED ORDER — DEXAMETHASONE SODIUM PHOSPHATE 10 MG/ML IJ SOLN
INTRAMUSCULAR | Status: DC | PRN
  Administered 2018-05-31: 10 mg via INTRAVENOUS

## 2018-05-31 MED ORDER — PROPOFOL 10 MG/ML IV BOLUS
INTRAVENOUS | Status: DC | PRN
  Administered 2018-05-31: 30 mg via INTRAVENOUS
  Administered 2018-05-31: 80 mg via INTRAVENOUS
  Administered 2018-05-31: 30 mg via INTRAVENOUS

## 2018-05-31 MED ORDER — DEXMEDETOMIDINE HCL IN NACL 200 MCG/50ML IV SOLN
0.4000 ug/kg/h | INTRAVENOUS | Status: DC
  Administered 2018-05-31: 0.4 ug/kg/h via INTRAVENOUS
  Filled 2018-05-31 (×2): qty 50

## 2018-05-31 MED ORDER — SODIUM CHLORIDE 0.9 % IV SOLN
INTRAVENOUS | Status: DC | PRN
  Administered 2018-05-31: 25 ug/min via INTRAVENOUS

## 2018-05-31 MED ORDER — ORAL CARE MOUTH RINSE: 15.0000 mL | OROMUCOSAL | Status: DC

## 2018-05-31 MED ORDER — SODIUM CHLORIDE 0.9 % IV BOLUS: 1000.0000 mL | Freq: Once | INTRAVENOUS | Status: DC

## 2018-05-31 MED ORDER — SUGAMMADEX SODIUM 200 MG/2ML IV SOLN
INTRAVENOUS | Status: DC | PRN
  Administered 2018-05-31: 100 mg via INTRAVENOUS

## 2018-05-31 MED ORDER — NOREPINEPHRINE 4 MG/250ML-% IV SOLN
0.0000 ug/min | INTRAVENOUS | Status: DC
  Filled 2018-05-31: qty 250

## 2018-06-01 ENCOUNTER — Other Ambulatory Visit (HOSPITAL_COMMUNITY): Payer: Medicare Other

## 2018-06-01 ENCOUNTER — Encounter (HOSPITAL_COMMUNITY): Payer: Self-pay | Admitting: Neurosurgery

## 2018-06-01 NOTE — Discharge Summary (Signed)
  Physician Discharge Summary  Patient ID: Theresa Lutz MRN: [id number] DOB/AGE: [birth year] 84 y.o.  Admit date: 05/17/2018 Discharge date: 06/01/2018  Admission Diagnoses:  Subarachnoid Hemorrhage  Discharge Diagnoses:  Same Active Problems:   Aneurysm [facility])   Discharged Condition: Deceased  Hospital Course:  Theresa Lutz is a 84 y.o. female admitted with progressive HA and new onset left ptosis. Her imaging demonstrated small amount of sentinel hemorrhage in the posterior fossa with large fusiform LICA aneurysm and similar fusiform RICA aneurysm. It was felt that the left side was symptomatic and did require treatment. She was started on dual antiplatelet therapy. She then underwent placement of Pipeline stent. While she did awaken somewhat post-procedure, she had sudden decline in the PACU and required re-intubation. Emergent MRI demonstrated new diffuse posterior fossa and basal SAH with severe posterior fossa mass effect and compression of the brainstem. Her clinical exam showed minimal to no brainstem reflexes and I felt her chance for any recovery was negligible. The situation was discussed with the patient's family. She became hypotensive and the decision was made not to add vasopressor support and instead proceed with terminal extubation. She subsequently expired.  Treatments: Surgery - Pipeline embolization of RICA aneurysm   Signed: Jairo Ben 06/01/2018, 9:41 AM

## 2018-06-05 NOTE — Progress Notes (Signed)
  NEUROSURGERY PROGRESS NOTE   Initially after surgery patient was extubated, was awake, but not following commands, breathing well and protecting airway, moving all extremities. She became acutely less responsive, had snoring respirations and apneic episodes. She was therefore reintubated for airway protection. I ordered stat MRI and she has now arrived back on the unit.   Current exam (>3hrs since intubation) off precedex: No eye opening to pain Pupils 26mm, non-reactive Not breathing over vent (-) Doll's eyes (-) cough (-) gag No motor responses  MRI and angiogram were again reviewed personally and with neuroradiology. MRI demonstrates massive, diffuse basal and posterior fossa SAH with significant posterior fossa mass effect and complete effacement of the 4th ventricle. There has been some enlargement of the lateral ventricles c/w obstructive HCP. MRA demonstrates patency of the Pipeline device and no large territory stroke is seen on diffusion images. Review of the angiogram does show some leakage of blood from the posterior cavernous portion of the aneurysm down the clivus.  I suspect that the patient had a small intraoperative rupture, and had a much more significant rupture in the PACU. Currently, her exam is c/w severe brainstem dysfunction. With her clinical exam, her age, and the radiographic findings, I think the chances of meaningful recovery are negligible. I do not think placement of a ventricular drain is reasonable given her poor exam, poor prognosis, and the significant risk of ICH because she is on ASA/Plavix.  I have reviewed the situation with the patient's family. I told them that I do not think there is any real chance of recovery and that this hemorrhage is fatal. We will cont with current supportive care while other family members can be with her. I told them that once the rest of the family is here we should proceed with one way extubation to allow her to pass. They were  understandably distraught but all questions were answered.

## 2018-06-05 NOTE — Brief Op Note (Signed)
  NEUROSURGERY BRIEF OPERATIVE NOTE   PREOP DX: Symptomatic fusiform right ICA aneurysm  POSTOP DX: Same  PROCEDURE: Diagnostic cerebral angiogram, Pipeline embolization RICA aneurysm  SURGEON: Dr. Consuella Lose, MD  ANESTHESIA: GETA  EBL: Minimal  SPECIMENS: None  COMPLICATIONS: None immediate  CONDITION: Hemodynamically stable to recovery  FINDINGS: 1. Fusiform partially thrombosed RICA aneurysm extending from petrous to distal supraclinoid ICU, treated with 3-device Pipeline telescoping construct. Good stasis within the aneurysm.

## 2018-06-05 NOTE — Progress Notes (Signed)
PCCM Interval Note  Patient underwent diagnostic cerebral angiogram with pipeline embolization RICA aneurysm earlier today.  Was extubated and in PACU, acutely deteriorated.  Per Neurosurgery's note, patient became unresponsive, pupils 67mm/ non reactive with absent dolls eyes, cough, and gag with snoring respirations.  Re-intubated for airway protection.  Taken for MRI/ MRA which showed massive, diffuse basal and posterior fossa SAH with significant posterior fossa mass effect and complete effacement of 4th ventricle c/w obstructive HCP.  Per Neurosurgery's note, not a candidate for EVD given her poor exam and prognosis and unfortunately this is a fatal bleed.     On arrival to bedside, patient remains acutely hypotensive with SBP in the 60's despite ongoing fluid bolus and no improvement in neuro status.  Patient's two daughters at bedside.  Given that patient remains a full code however now has an non-survivable head bleed and remains hypotensive, I am concerned that death may be imminent.  We discussed temporary life support with vasopressors till the arrival of remaining family expected in the next hour to two hours . Daughters insist that their mother would not want any further life support and wish for her to be comfortable.  They understand she may pass prior to family arriving.  She does not exhibit any signs of discomfort or pain currently.     P:  Now a DNR/ DNI Fentanyl and versed orders in place for comfort Awaiting remaining family to arrive and will likely transition to one way extubation and full comfort care.      Kennieth Rad, AGACNP-BC [place] [facility] Pgr: [phone] or if no answer [phone] June 21, 2018, 8:01 PM

## 2018-06-05 NOTE — Progress Notes (Signed)
Change in loc/ resp staus / anesthesia summoned , at bedside

## 2018-06-05 NOTE — Anesthesia Preprocedure Evaluation (Addendum)
Anesthesia Evaluation  Patient identified by MRN, date of birth, ID band Patient awake    Reviewed: Allergy & Precautions, NPO status , Patient's Chart, lab work & pertinent test results  Airway Mallampati: II  TM Distance: >3 FB Neck ROM: Full    Dental  (+) Teeth Intact, Dental Advisory Given   Pulmonary neg pulmonary ROS,    breath sounds clear to auscultation       Cardiovascular negative cardio ROS   Rhythm:Regular Rate:Normal     Neuro/Psych negative neurological ROS     GI/Hepatic negative GI ROS, Neg liver ROS,   Endo/Other  negative endocrine ROS  Renal/GU negative Renal ROS     Musculoskeletal negative musculoskeletal ROS (+)   Abdominal Normal abdominal exam  (+)   Peds  Hematology negative hematology ROS (+)   Anesthesia Other Findings   Reproductive/Obstetrics                            Anesthesia Physical Anesthesia Plan  ASA: II  Anesthesia Plan: General   Post-op Pain Management:    Induction: Intravenous  PONV Risk Score and Plan: 4 or greater and Ondansetron, Dexamethasone and Treatment may vary due to age or medical condition  Airway Management Planned: Oral ETT  Additional Equipment: Arterial line  Intra-op Plan:   Post-operative Plan: Possible Post-op intubation/ventilation  Informed Consent: I have reviewed the patients History and Physical, chart, labs and discussed the procedure including the risks, benefits and alternatives for the proposed anesthesia with the patient or authorized representative who has indicated his/her understanding and acceptance.   Dental advisory given  Plan Discussed with: CRNA  Anesthesia Plan Comments:       Anesthesia Quick Evaluation

## 2018-06-05 NOTE — Progress Notes (Signed)
Anesthesia present for case 

## 2018-06-05 NOTE — Progress Notes (Signed)
Time of Death 2130. No cardiac or lung sounds auscultated by two nurses, Forrest Moron, RN and myself.  Family at bedside. Only belonging found was one ring, that daughter took with her.  Dr. Lucile Shutters notified with time of death.

## 2018-06-05 NOTE — Progress Notes (Signed)
Unable to read temperature axillary. Brooke, NP with CCM just had discussion with family, who expressed the patient would want comfort measures. No attempt for rectal temp, for patient comfort. Applied warm blankets to patient. Monitoring patient closely for any signs of discomfort.

## 2018-06-05 NOTE — Progress Notes (Signed)
Pt taken to MRI with RT and PA assist.

## 2018-06-05 NOTE — Procedures (Addendum)
Extubation Procedure Note  Patient Details:   Name: DESLYN CAVENAUGH DOB: 05/05/1933 MRN: [id number]   Airway Documentation:    Vent end date: Jun 15, 2018 Vent end time: 2050   Evaluation  O2 sats: currently acceptable Complications: No apparent complications Patient did not tolerate procedure well.     No   **Patient was a terminal wean.  Patient placed on 2L [place] after wean.  Jaymes Revels, [place] P June 15, 2018, 8:58 PM

## 2018-06-05 NOTE — Progress Notes (Signed)
Report called to Sam in short stay. Pt walked to SS by RN, placed on their monitor. Bedside handoff with Sam. Family at bedside.

## 2018-06-05 NOTE — Progress Notes (Signed)
Pt  flailing arms and moving her legs upon arrival to PACU.  She appears confused, no speech is noted and she is not following commands. BP meds given per order to bring SBP into ordered parameters. Pt did begin to calm down and pressures were responding to meds. Pt was still however not following any commands. At approx 1540 pt began to have sonorous resps and appeared to be in resp distress. Anesthesia notified and pt was reintubated for airway protection.

## 2018-06-05 NOTE — Anesthesia Postprocedure Evaluation (Signed)
Anesthesia Post Note  Patient: Theresa Lutz  Procedure(s) Performed: EMBOLIZATION (N/A )     Patient location during evaluation: SICU Anesthesia Type: General Level of consciousness: sedated Pain management: pain level controlled Vital Signs Assessment: post-procedure vital signs reviewed and stable Respiratory status: patient remains intubated per anesthesia plan Cardiovascular status: stable Postop Assessment: no apparent nausea or vomiting Anesthetic complications: no Comments: Altered mental status in PACU. Intubated for airway protection. MRA pending.     Last Vitals:  Vitals:   June 14, 2018 1715 06/14/2018 1730  BP: 137/76 140/66  Pulse: 91 92  Resp:    Temp:    SpO2: 99% 99%    Last Pain:  Vitals:   Jun 14, 2018 0800  TempSrc: Oral  PainSc: 0-No pain                 [name]

## 2018-06-05 NOTE — Progress Notes (Signed)
RT note: Report ABG ph results of 7.17 to DR Nundkumar.

## 2018-06-05 NOTE — Anesthesia Procedure Notes (Signed)
Procedure Name: Intubation Date/Time: 2018/06/23 11:40 AM Performed by: Gaylene Brooks, CRNA Pre-anesthesia Checklist: Patient identified, Emergency Drugs available, Suction available and Patient being monitored Patient Re-evaluated:Patient Re-evaluated prior to induction Oxygen Delivery Method: Circle System Utilized Preoxygenation: Pre-oxygenation with 100% oxygen Induction Type: IV induction Ventilation: Mask ventilation without difficulty Laryngoscope Size: Miller and 2 Grade View: Grade I Tube type: Oral Tube size: 7.0 mm Number of attempts: 1 Airway Equipment and Method: Stylet and Oral airway Placement Confirmation: ETT inserted through vocal cords under direct vision,  positive ETCO2 and breath sounds checked- equal and bilateral Secured at: 21 cm Tube secured with: Tape Dental Injury: Teeth and Oropharynx as per pre-operative assessment

## 2018-06-05 NOTE — Progress Notes (Signed)
Called to bedside in PACU because patient had sudden decrease in LOC with snoring resp. Patient subsequently re-intubated by anesthesia and placed on precedex.  Right pupil: 57mm, sluggishly reactive. left pupil: 19mm sluggishly reactive.  Moves BLE to painful stimulus L>R. Minimal flexion LUE to painful stimulus. No real response on RUE.  Will obtain stat MRI/MRA brain wo contrast.

## 2018-06-05 NOTE — Transfer of Care (Addendum)
Immediate Anesthesia Transfer of Care Note  Patient: Theresa Lutz  Procedure(s) Performed: EMBOLIZATION (N/A )  Patient Location: PACU  Anesthesia Type:General  Level of Consciousness: awake, drowsy and patient cooperative  Airway & Oxygen Therapy: Patient Spontanous Breathing and Patient connected to face mask oxygen  Post-op Assessment: Report given to RN and Post -op Vital signs reviewed and stable  Post vital signs: Reviewed and stable  Last Vitals:  Vitals Value Taken Time  BP 145/85 June 16, 2018  3:01 PM  Temp    Pulse 71 06-16-2018  3:03 PM  Resp 21 2018-06-16  3:04 PM  SpO2 99 % Jun 16, 2018  3:03 PM  Vitals shown include unvalidated device data.  Last Pain:  Vitals:   06/16/2018 0800  TempSrc: Oral  PainSc: 0-No pain      Patients Stated Pain Goal: 3 (55/01/58 6825)  Complications: No apparent anesthesia complications   PA at bedside performing post-op neuro assessment.   Moving all extremities.

## 2018-06-05 NOTE — Progress Notes (Signed)
RT note: RT transported patient from MRI to 4N22. Patients vital signs stable through out.

## 2018-06-05 NOTE — Progress Notes (Signed)
  NEUROSURGERY PROGRESS NOTE   No issues overnight. Didn't sleep well last night, but no new complaints.  EXAM:  BP (!) 142/83   Pulse 71   Temp 98.3 F (36.8 C) (Oral)   Resp (!) 21   Ht 5\' 4"  (1.626 m)   Wt 49.9 kg   SpO2 97%   BMI 18.88 kg/m   Awake, alert, oriented  Speech fluent, appropriate  CN grossly intact x partial left ptosis 5/5 BUE/BLE   IMPRESSION:  84 y.o. female with symptomatic large fusiform LICA aneurysm, and concurrent likely asymptomatic RICA aneurysm  PLAN: - Will plan on proceeding with angiogram and endovascular treatment of the symptomatic LICA aneurysm today. - Dose of ASA/Plavix this am preoperatively  I have again discussed the rationale for treatment, expected postop course, and the risks of treatment with the patient and her family at the bedside. All questions were answered, and they are ready to proceed.

## 2018-06-05 NOTE — Progress Notes (Signed)
Discussed need for CCM assistance with Dr Halford Chessman.

## 2018-06-05 NOTE — Anesthesia Procedure Notes (Signed)
Arterial Line Insertion Start/End21-Oct-2019 10:15 AM, 06-25-2018 10:30 AM Performed by: Josephine Igo, CRNA, CRNA  Patient location: Pre-op. Preanesthetic checklist: patient identified, IV checked, site marked, surgical consent and monitors and equipment checked Lidocaine 1% used for infiltration Right, radial was placed Catheter size: 20 G Hand hygiene performed , maximum sterile barriers used  and Seldinger technique used  Attempts: 1 Procedure performed without using ultrasound guided technique. Following insertion, dressing applied and Biopatch. Post procedure assessment: normal

## 2018-06-05 NOTE — Progress Notes (Signed)
Returning from MRI, preparing for EVD placement. Pt without cough, gag or pupillary reflex. Dr Kathyrn Sheriff and PA at bedside.

## 2018-06-05 DEATH — deceased

## 2018-07-09 ENCOUNTER — Other Ambulatory Visit: Payer: Self-pay | Admitting: Oncology

## 2019-04-15 ENCOUNTER — Ambulatory Visit: Payer: Medicare Other | Admitting: Oncology

## 2019-04-15 ENCOUNTER — Other Ambulatory Visit: Payer: Medicare Other

## 2019-04-22 IMAGING — XA IR ANGIO/EXISTING CATHETER
7 of 12 series · 8 of 24 positions shown · IV contrast (IODINE)
Comparison: none

PROCEDURE:
DIAGNOSTIC CEREBRAL ANGIOGRAM

PIPELINE EMBOLIZATION LEFT INTERNAL CAROTID ARTERY ANEURYSM
HISTORY: Mrs. Bob is an 85 year old woman who initially presented to the
emergency department with progressively worsening headache, and new
onset of left ptosis. She had extensive radiographic workup
including CT, MRI, CTA, and MRA which all demonstrated partially
thrombosed fusiform left internal carotid artery aneurysm extending
essentially from the petrous through the supraclinoid ICA. There
were also some subtle MRI findings suggesting sentinel hemorrhage.
Given her clinical history and radiographic findings, treatment of
the symptomatic left ICA aneurysm was recommended. She is therefore
brought down from the ICU to the angiography suite today for the
procedure.
TECHNIQUE: CATHETERS AND WIRES
5-Manuelito Keel 2 glidecatheter catheter

[Series 1: cerebral care 2 · 2 acquisitions, 1 frame shown (1 of 6)]
[im 1/2]
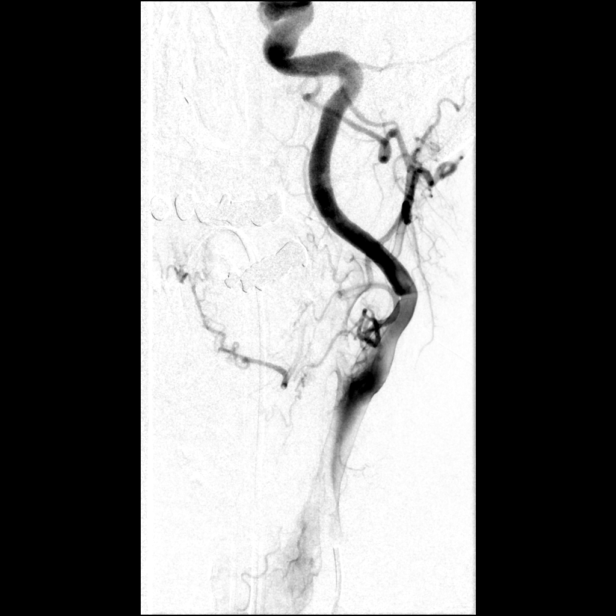

[Series 3: cerebral care 2 · 2 acquisitions, 1 frame shown (2 of 6)]
[im 1/2]
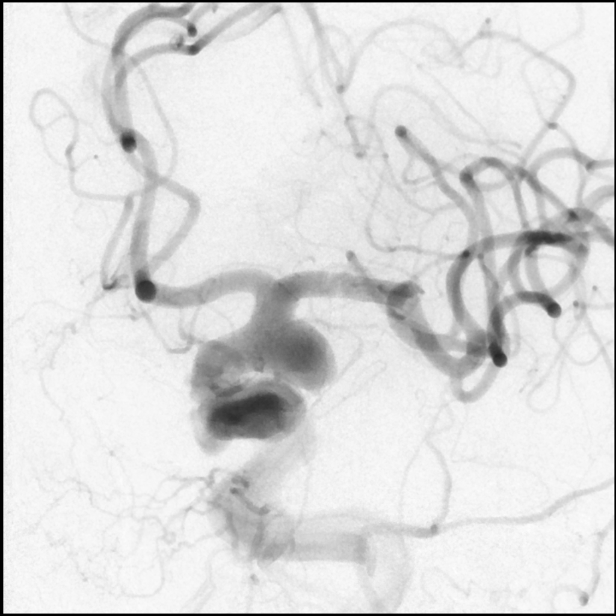

[Series 7: cerebral care 2 · 2 acquisitions, 1 frame shown (3 of 6)]
[im 1/2]
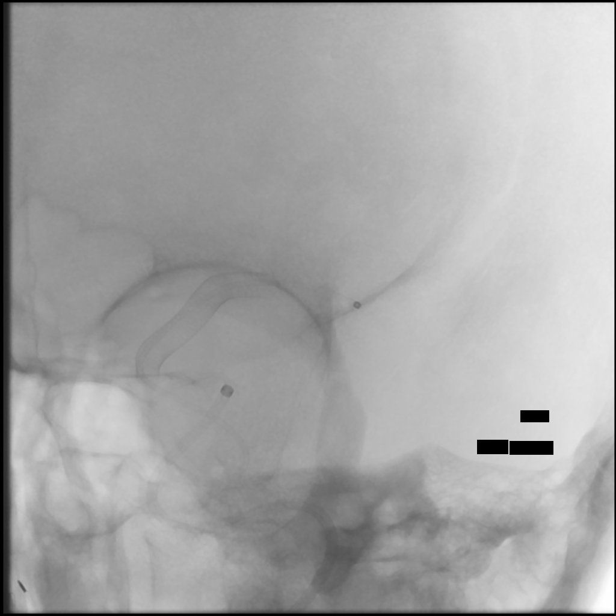

[Series 8: cerebral care 2 · 2 acquisitions, 1 frame shown (4 of 6)]
[im 1/2]
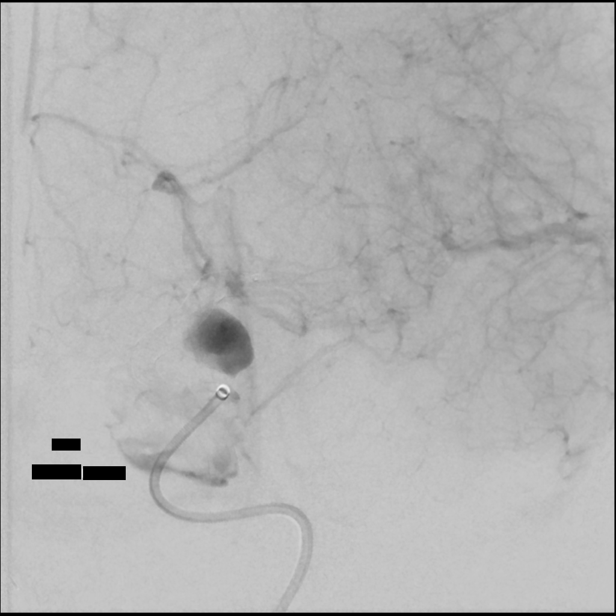

[Series 10: cerebral care 2 · 2 acquisitions, 1 frame shown (5 of 6)]
[im 1/2]
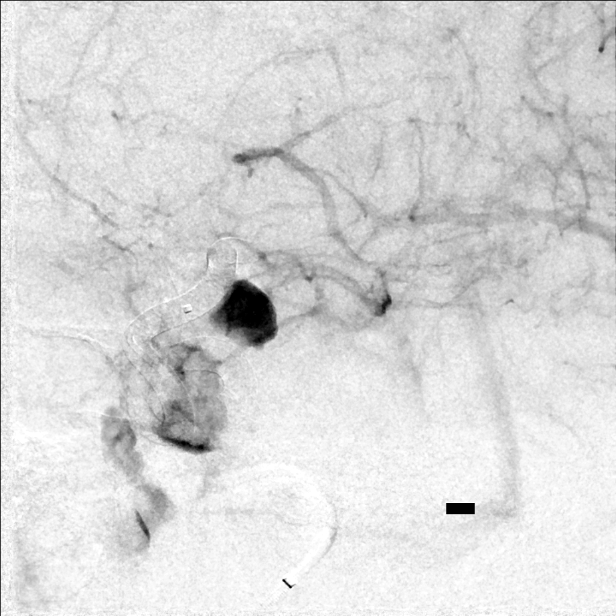

[Series 12: cerebral care 2 · 2 acquisitions, 1 frame shown (6 of 6)]
[im 1/2]
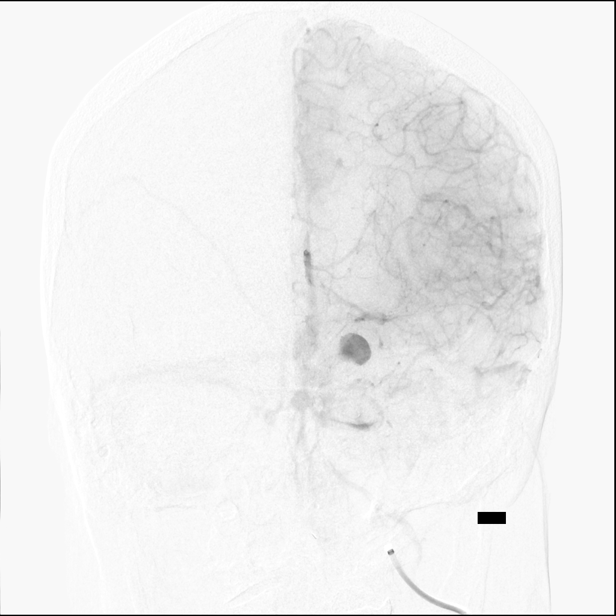

[Series 300: dr. (person_name) · 2 of 33 slices shown]
[im 3/33]
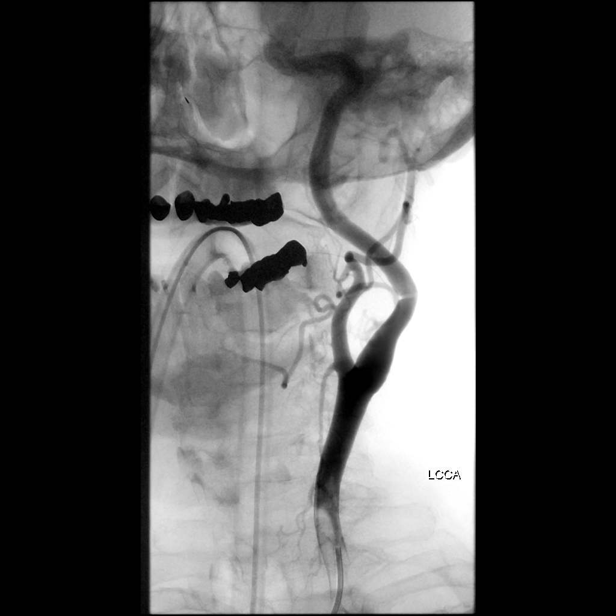
[im 27/33]
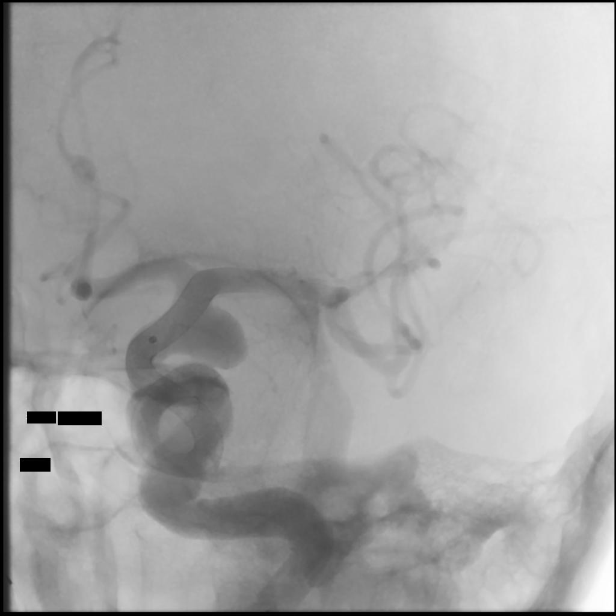

[8 of 24 positions shown; findings below may reference images not displayed]

ACCESS:
The technical aspects of the procedure as well as its potential
risks and benefits were reviewed with the patient and her family.
These risks included but were not limited to stroke, intracranial
hemorrhage, bleeding, infection, allergic reaction, damage to organs
or vital structures, stroke, non-diagnostic procedure, and the
catastrophic outcomes of heart attack, coma, and death. With an
understanding of these risks, informed consent was obtained and
witnessed. The patient was placed in the supine position on the
angiography table and the skin of right groin prepped in the usual
sterile fashion. The procedure was performed under general
anesthesia. A 5- French sheath was introduced in the right common
femoral artery using Seldinger technique.

MEDICATIONS:
HEPARIN: 9888 Units total.

CONTRAST:  75mL OMNIPAQUE IOHEXOL 300 MG/ML  SOLNcc, Omnipaque 300

FLUOROSCOPY TIME:  FLUOROSCOPY TIME: See IR records
180 cm 0.035" glidewire

0.035" Glideadvantage wire

0.035" Rosen wire

0.035" Roadrunner wire

6-French NeuronMax guide sheath

6-French Ximmons-C Select JB-1 catheter

0.058" CatV guidecatheter

150 cm Phenom microcatheter

Synchro 2 standard microwire

PIPELINE DEVICE USED
4.0mm x 35mm Pipeline Flex

4.25mm x 35mm Pipeline Flex

5.0 x 35mm Pipeline Flex

VESSELS CATHETERIZED
Left common carotid

Left internal carotid

Left middle cerebral artery

Right common femoral

VESSELS STUDIED
Left common carotid, neck

Left common carotid, head

Left internal carotid artery, head (during embolization)

Left internal carotid artery, head (immediate post-embolization)

Left internal carotid artery, head (final control)

Right common femoral

PROCEDURAL NARRATIVE
After femoral access was obtained, Beleniuc 2 glide catheter was
introduced over the Glidewire. The secondary curve was reformed over
the aortic arch. The left common carotid artery was then
catheterized. Cervical and cerebral angiogram was taken. Catheter
was then removed without incident. After reviewing the images, we
did elect to proceed with the embolization portion of the procedure.
The 5 French sheath was exchanged over the glidewire to an 8 French
sheath. The neuron max guide sheath was then introduced over the
Beleniuc 2 select catheter and Glidewire. Beleniuc 2 catheter was then
reformed over the aortic arch. The left common carotid artery was
selected. The guide sheath was then advanced over the Beleniuc
catheter into the proximal left common carotid artery. Beleniuc
catheter and Glidewire were then removed. The Cat V guide catheter
was then introduced over the Phenom microcatheter and Synchro 2
standard micro wire. The guide catheter was then advanced into the
distal cervical internal carotid artery. Multiple attempts were made
to advance the guide sheath into the distal cervical common carotid
however due to the tortuosity of the aortic arch, these attempts
failed. I therefore removed the micro wire and microcatheter. And a
series of 0.035 inch wires were attempted in order to provide some
support to advance the guide sheath into the distal common carotid.
Ultimately, placement of a Rosen wire into the distal cervical
internal carotid artery through the guide catheter allowed
advancement of the guide sheath into the distal cervical carotid.

The Rosen wire was then removed, and the Phenom micro catheter was
introduced over the micro wire. Under roadmap guidance, the
microcatheter was advanced into the M2 segment of the left middle
cerebral artery. This allowed advancement of the guide catheter into
the proximal cavernous internal carotid artery.

At this point the 1st pipeline device was introduced. It was
deployed with the distal end in the mid M1 segment, and deployed
back into the proximal supraclinoid internal carotid artery. The
microcatheter was then advanced over pusher wire to maintain endo
luminal access. The pusher wire was captured and removed. The 2nd
pipeline device was then introduced, and in a similar fashion, the
device was deployed in Colin Georges scoping fashion within the 1st device,
with the distal and at approximately the level of the ICA
bifurcation. The proximal end of this device extended to
approximately the anterior genu of the cavernous carotid. The
microcatheter was then advanced again over the pusher wire and the
pusher wire was removed. The 3rd device was then introduced, and
again deployed within the previous construct, all the way back to
the proximal cavernous carotid.

At this point, angiogram was taken which did appear to demonstrate
good vessel wall apposition of the proximal and distal ends of the
construct. There did not appear to be any difficulty in obtaining
and a luminal access if needed. I therefore removed the
microcatheter. Another run was taken through the guide catheter.
Guide catheter was then withdrawn down into the cervical internal
carotid artery, and final control angiogram was taken. The entire
construct was then removed without incident.
FINDINGS: Left common carotid, neck:

The cervical common carotid artery is widely patent, without any
stenosis or atherosclerotic disease involving the common carotid
bifurcation or the proximal internal carotid artery. The visualized
cervical branches of the external carotid artery are unremarkable.

Left common carotid, head:

Injection demonstrates the left internal carotid artery to be widely
patent. There is fusiform dilatation of the intracranial portion of
the internal carotid artery extending from the distal petrous
segment through the supraclinoid segment. There does appear to be a
partially thrombosed portion of this fusiform aneurysm essentially
encompassing the entire cavernous portion of the carotid. There is
also a fairly wide-based more saccular component to the aneurysm
projecting laterally in the distal supraclinoid segment of the
carotid. The anterior cerebral artery and middle cerebral artery
appear to be normal. Capillary phase is unremarkable. Venous sinuses
are widely patent. The visualized cranial branches of the external
carotid artery are unremarkable.

Left internal carotid artery, head (during embolization):

Injection reveals the presence of a widely patent ICA that leads to
a patent ACA and MCA. Angiogram taken during deployment of the first
stent demonstrates the stent to be widely patent and with good
vessel wall apposition. Similar angiogram taken after deployment of
the second stent demonstrates good wall apposition, and patency of
the stent construct as well as the distal middle and anterior
cerebral arteries. Angiogram taken after deployment of the third
stent also demonstrates patency of the stent construct. There is
significant contrast stasis seen within the more saccular laterally
projecting supraclinoid portion of the aneurysm. There is also a
significant amount of contrast stasis seen within portions of the
partially thrombosed cavernous aneurysm. Of note, after deployment
of the third stent, there is contrast extravasation from the
posterior portion of the proximal cavernous aneurysm which appears
to track down along the clivus. The contrast does appear to
dissipate.

Left internal carotid artery, head (immediate post-embolization):

Injection reveals the presence of a widely patent ICA that leads to
a patent ACA and MCA. The pipeline construct is seen extending from
the posterior genu of the cavernous carotid to the mid M1 segment.
The pipeline construct appears to be well apposed both proximally
and distally. The internal carotid artery is widely patent as is the
middle cerebral artery and anterior cerebral artery. There is no
filling defect within the lumen of the internal carotid or the stent
construct to suggest thrombotic complication. There is contrast
stasis within the supraclinoid portion of the aneurysm, as well as
contrast stasis within the large cavernous portion of the aneurysm
with multiple small filling defects suggesting partial thrombosis of
this segment of the aneurysm. There is again seen some contrast
extravasation from the posterior aspect of the cavernous segment of
the aneurysm which tracks along the posterior aspect of the clivus.
The origin of this contrast appears to arise from a segment of the
aneurysm which is covered by the pipeline device. Similar to the
prior angiogram, the contrast appears to washout slowly.

Left internal carotid artery, head (final control):

Injection reveals the presence of a widely patent ICA that leads to
a patent ACA and MCA. No thrombus is visualized. The pipeline
construct is in stable position and is widely patent. Again
demonstrated is significant contrast stasis within the cavernous and
supraclinoid portions of the aneurysm. No branch occlusions are
identified. Again seen is some small amount of contrast
extravasation from the posterior genu of the cavernous segment of
the aneurysm which tracks along the posterior margin of the clivus.

Right femoral:

Normal vessel. No significant atherosclerotic disease. Arterial
sheath in adequate position.

DISPOSITION:
Upon completion of the study, the femoral sheath was removed and
hemostasis obtained using a 7-Fr ExoSeal closure device. Good
proximal and distal lower extremity pulses were documented upon
achievement of hemostasis. The procedure was well tolerated and no
early complications were observed.

The patient was extubated in the room and transferred to the
postanesthesia care unit in stable hemodynamic condition. She was
observed to be protecting her airway and breathing spontaneously,
moving both upper and lower extremities spontaneously but not to
command..
IMPRESSION: 1. Successful pipeline embolization of a large fusiform left
internal carotid artery aneurysm utilizing a 3-device telescoping
construct. While no thrombotic or embolic complications were noted,
on the post embolization angiograms there is a small amount of
contrast extravasation from the posterior aspect of the cavernous
carotid which is covered by the pipeline stent which tracks along
the posterior aspect of the clivus.

The preliminary results of this procedure were shared with the
patient and the patient's family.
